# Patient Record
Sex: Male | Born: 1960 | Race: White | Hispanic: No | Marital: Married | Smoking: Never smoker
Health system: Southern US, Community
[De-identification: ages and names within clinical notes are randomized; demographics above are authoritative.]

## PROBLEM LIST (undated history)

## (undated) DIAGNOSIS — I519 Heart disease, unspecified: Secondary | ICD-10-CM

## (undated) DIAGNOSIS — K219 Gastro-esophageal reflux disease without esophagitis: Secondary | ICD-10-CM

## (undated) DIAGNOSIS — I219 Acute myocardial infarction, unspecified: Secondary | ICD-10-CM

## (undated) DIAGNOSIS — M519 Unspecified thoracic, thoracolumbar and lumbosacral intervertebral disc disorder: Secondary | ICD-10-CM

## (undated) DIAGNOSIS — E785 Hyperlipidemia, unspecified: Secondary | ICD-10-CM

## (undated) HISTORY — PX: SHOULDER ARTHROSCOPY: SHX128

## (undated) HISTORY — PX: WISDOM TOOTH EXTRACTION: SHX21

## (undated) HISTORY — DX: Hyperlipidemia, unspecified: E78.5

## (undated) HISTORY — DX: Acute myocardial infarction, unspecified: I21.9

## (undated) HISTORY — PX: TONSILLECTOMY AND ADENOIDECTOMY: SUR1326

## (undated) HISTORY — DX: Heart disease, unspecified: I51.9

## (undated) HISTORY — DX: Unspecified thoracic, thoracolumbar and lumbosacral intervertebral disc disorder: M51.9

## (undated) HISTORY — DX: Gastro-esophageal reflux disease without esophagitis: K21.9

---

## 2007-02-28 ENCOUNTER — Inpatient Hospital Stay (HOSPITAL_COMMUNITY): Admission: AD | Admit: 2007-02-28 | Discharge: 2007-03-04 | Payer: Self-pay | Admitting: Cardiovascular Disease

## 2007-02-28 DIAGNOSIS — I219 Acute myocardial infarction, unspecified: Secondary | ICD-10-CM | POA: Insufficient documentation

## 2007-02-28 HISTORY — DX: Acute myocardial infarction, unspecified: I21.9

## 2007-02-28 HISTORY — PX: CORONARY STENT PLACEMENT: SHX1402

## 2011-01-03 NOTE — Discharge Summary (Signed)
NAME:  Tommy Myers, TROIANI NO.:  [id number]   MEDICAL RECORD NO.:  [id number]          PATIENT TYPE:  INP   LOCATION:  2016                         FACILITY:  [facility]   PHYSICIAN:  Raymon Mutton, P.A. DATE OF BIRTH:  1960/10/28   DATE OF ADMISSION:  02/28/2007  DATE OF DISCHARGE:  03/04/2007                               DISCHARGE SUMMARY   DISCHARGE DIAGNOSES:  1. Status post acute diaphragmatic myocardial infarction, treated with      emergency __________ stenting of the right coronary artery on February 28, 2007.  2. Positive family history of coronary artery disease.  3. Mild left ventricular dysfunction with ejection fraction 45%.  4. Hypertension - asymptomatic.  5. Borderline hyperlipidemia on statin therapy.   HOSPITAL PROCEDURES:  Coronary angiography performed by Dr. Allyson Sabal on  February 28, 2007, which revealed 50% of left main stenosis versus spasms  and high-grade lesion in the middle of RCA, treated with the bare metal  stent.  All the rest of the coronaries were free of disease.  Cath also  revealed moderate hypokinesis of the inferior wall, and ejection  fraction was 45%.   HISTORY AND PHYSICAL AND HOSPITAL COURSE:  This is a 50 year old  Caucasian gentleman who presented to the emergency room after he  developed chest pain with shortness of breath and a profuse sweating and  weakness around 2:30 p.m. on the day of his presentation.  The patient  was jogging in the park, when all of a sudden, he developed severe  pressure in the mid chest radiating to the back and was also feeling  extremely clammy.  He came home, rested a little bit, wanted to take a  shower, but while in the shower, experienced few more episodes of severe  chest pain, which actually forced him to call EMS.  En route, the EKG  revealed ST elevation in the inferior leads.  The patient was given  aspirin 324 mg, started a bolus and drip of heparin and nitro and  presented straight to  the cath lab.  Code STEMI was initiated, and Dr.  Allyson Sabal performed emergency stenting of the RCA.  The RCA had thrombotic  lesion and extraction thrombectomy was performed.  So, there is  substantial thrombus.  Large vessel required  non-bare metal stent.  The  patient was given bolus and infusion of Integrilin, and the lesion was  reduced from 95% to 0%, and TIMI-3 flow was restored.   Next morning, the patient was seen by Dr. Allyson Sabal, and we continued  cycling his enzymes.  His peak enzyme abnormality revealed CK of 1624  and CK-MB to 140.   The patient was maintained in CCU for 72 hours and after that transfer  to step-down unit.  He had hypertension but was asymptomatic with that  and stated that his blood pressure usually runs low anyway.  On March 04, 2007, he was seen by Dr. Allyson Sabal, and it was the fourth day post MI, and  decision was made to discharge him home with return of his followup  within 2 weeks.  LABORATORY:  His CBC showed hemoglobin of 13.3, hematocrit 38.6, white  blood cell count 8.5, platelet count 242.  BMP revealed sodium 141,  potassium 3.8, chloride 103, CO2 of 28, glucose was 90, BUN 15,  creatinine 1.16, calcium 9.2.  Lipid profile showed total cholesterol  238, triglycerides 109, HDL 54, and LDL 162.  The patient was started on  statin therapy.   DISCHARGE DIET:  Low-salt, low-fat, low-cholesterol diet.   DISCHARGE ACTIVITY:  He was not allowed to drive or lift weights greater  than 10 pounds for 1 week.  Increase activity slowly and followup  appointment scheduled with Dr. Allyson Sabal on July 28 at 3:45 p.m.   DISCHARGE MEDICATIONS:  1. Aspirin 325 mg daily.  2. Plavix 75 mg daily.  3. Toprol XL 25 mg daily.  4. Inspra 50 mg daily.  5. Lipitor 80 mg daily.      Raymon Mutton, P.A.     MK/MEDQ  D:  03/04/2007  T:  03/04/2007  Job:  [id number]   cc:   Nanetta Batty, M.D.

## 2011-01-03 NOTE — Cardiovascular Report (Signed)
NAME:  JERMAN, TINNON NO.:  [id number]   MEDICAL RECORD NO.:  [id number]          PATIENT TYPE:  OIB   LOCATION:  2910                         FACILITY:  [facility]   PHYSICIAN:  Nanetta Batty, M.D.   DATE OF BIRTH:  Jun 01, 1961   DATE OF PROCEDURE:  02/28/2007  DATE OF DISCHARGE:                            CARDIAC CATHETERIZATION   HISTORY OF PRESENT ILLNESS:  Mr. Mcpeek is a 50 year old married white  male with positive risk factors including family history and occasional  smoking.  He is 27 and very fit appearing and athletic.  After running  and doing pushups today, and while taking a shower, he developed some  chest and back pain.  The pain became pronounced and constant around  2:00 p.m.  He presented to [facility] ER where he was found to have  inferior ST elevation.  He was treated with IV heparin bolus of 4000 and  infusion as well as baby aspirin.  He was transferred by [facility] to  [facility] for emergency cardiac catheterization and potential intervention.   DESCRIPTION OF PROCEDURE:  The patient was brought to the second floor  [facility] cardiac cath lab urgently for catheter intervention.   His right groin was prepped and draped in usual sterile fashion.  Xylocaine 1% was used local anesthesia.  A 6-French sheath was inserted  into the right femoral vein using standard Seldinger technique.  A 7-  French sheath was inserted into the right femoral artery.  A 5-French  balloon-tipped temporary transvenous pacemaker was placed in the RV apex  because of bradycardia noted on route to the cath lab.  It was noted  that the patient's central pressure was higher with his intrinsic beat  and lower with paced beats.  He apparently runs blood pressure in the  100-120 range.  Blood pressure ran in the 70-90 range, and therefore, he  was placed on IV dobutamine as well.  A 6-French right and left Judkins  diagnostic catheter as well as French pigtail catheter were used  for  selective coronary angiography, and left ventriculography respectively.  Visipaque dye was used entirety of the case.  Retrograde aortic, left  ventricular blood pressures were recorded.   HEMODYNAMIC RESULTS:  1. Aortic systolic pressure 88, diastolic pressure 74.  Ventricle      systolic pressure 89, end-diastolic pressure 6.   SELECTIVE CORONARY CHOLANGIOGRAPHY:  1. Left main; there was some tapering of the os in the left main in      the 40-50% range.  I suspect this is related to spasm.  2. LAD; widely patent.  3. Circumflex; nondominant widely patent.  4. Right coronary artery; this is a large super dominant vessel which      is the infarct-related vessel.  There was a 95% lesion in the      midportion with extensive thrombus extending from the lesion down      into the distal part of the vessel.   VENTRICULOGRAPHY:  RAO left ventriculogram was performed using 25 mL of  Visipaque dye at 12 mL per second.  The overall LVEF was estimated  at  approximately 40% with moderate inferior hypokinesia.   DESCRIPTION OF PROCEDURE:  The patient received an additional 2000 units  of heparin, making a total of 6000.  The ending ACT was 284.  He  received aspirin in the emergency room, 600 mg of p.o. Plavix,  Integrilin double bolus infusion.  Isovue dye was used through the  entirety of the case.  Aortic pressures monitored during the case.  The  patient did receive 200 mcg intracoronary nitroglycerin.   Using a 7-French JR-4 side-hole guide catheter along with an OM4 190  Asahi wire and a [place] extraction thrombectomy catheter, multiple  passes were performed across the lesion into the distal lesion removing  a significant amount of thrombus with marked thrombus resolution.  Following this, prep stenting was performed with a 50 x 20 Liberte stent  at 10-12 atmospheres and postdilated with a 50 15-Quantum Maverick at 18  atmospheres (5.2 mm).  Following this intravascular  ultrasound  interrogation was performed of the RCA beginning distal to the stent and  extending into the proximal right coronary artery.  The distal reference  segment measured 4.9 x 5.1 mm.  There was an area of plaque just distal  to the stent that was not covered by the stent measuring approximately  2.5 x .2.5 (5 mm squared).  The stent was well opposed throughout its  entirety.  Because of the residual plaque in a large dominant vessel, an  additional stent was used to cover this plaque (5.0 x 12 mm Liberte).  It was deployed at 12 atmospheres and postdilated with the 50 15-mm  Quantum Maverick at 18 atmospheres.  Completion of this was performed  revealing excellent stent apposition throughout its entirety.  Completion of the angiogram revealed TIMI III flow with intact PDA and  PLA.  The patient tolerated the procedure well.  The guidewire and  catheter were removed.  The sheaths were sewn securely in place, and  temporary transvenous pacemaker was secured in place.   IMPRESSION:  Successful extraction thrombectomy, PCI and stenting using  bare metal stent because of the size of the vessel with IBIS guidance.  I am going to keep the patient on dopamine to maintain his systolic  blood pressure greater than 90.  He did receive 0.5 mg of atropine  during the case because of bradycardia and hypertension.  The arterial  sheath will be removed once the ACT falls below 170.  The Integrilin  will be continued for 18 hours.  He will use aspirin, Plavix, low-dose  beta blocker, low-dose ACE inhibitor and statin drug, blood pressure  permitting.  He left the cath lab hemodynamically stable.  He had  residual chest pain and residual ST-segment elevation.      Nanetta Batty, M.D.  Electronically Signed     JB/MEDQ  D:  02/28/2007  T:  03/01/2007  Job:  [id number]   cc:   Cardiac Catheterization Lab  [facility]  Chales Salmon. Abigail Miyamoto, M.D.

## 2011-06-06 LAB — LIPID PANEL
LDL Cholesterol: 162 — ABNORMAL HIGH
Triglycerides: 109
VLDL: 22

## 2011-06-06 LAB — CARDIAC PANEL(CRET KIN+CKTOT+MB+TROPI)
CK, MB: 240.4 — ABNORMAL HIGH
CK, MB: 29.1 — ABNORMAL HIGH
Relative Index: 14.8 — ABNORMAL HIGH
Total CK: 1861 — ABNORMAL HIGH
Troponin I: 29.8
Troponin I: 32.09

## 2011-06-06 LAB — BASIC METABOLIC PANEL
BUN: 15
BUN: 21
CO2: 26
CO2: 29
Calcium: 8.3 — ABNORMAL LOW
Calcium: 8.7
Calcium: 9.2
Chloride: 101
Creatinine, Ser: 1.16
Creatinine, Ser: 1.16
GFR calc Af Amer: >60
GFR calc Af Amer: >60
GFR calc non Af Amer: >60
GFR calc non Af Amer: >60
Glucose, Bld: 150 — ABNORMAL HIGH
Glucose, Bld: 90
Potassium: 3.3 — ABNORMAL LOW
Sodium: 134 — ABNORMAL LOW
Sodium: 136
Sodium: 140
Sodium: 141

## 2011-06-06 LAB — CK TOTAL AND CKMB (NOT AT ARMC)
CK, MB: 182.6 — ABNORMAL HIGH
CK, MB: 96.1 — ABNORMAL HIGH
Relative Index: 9.6 — ABNORMAL HIGH
Total CK: 996 — ABNORMAL HIGH

## 2011-06-06 LAB — CBC
HCT: 41.3
MCV: 90.3
Platelets: 242
Platelets: 282
RDW: 12.9
RDW: 13.3

## 2011-06-06 LAB — COMPREHENSIVE METABOLIC PANEL
Albumin: 3.9
BUN: 20
Creatinine, Ser: 1.17
Potassium: 3.9
Total Protein: 6.4

## 2011-06-06 LAB — PROTIME-INR: INR: 1

## 2011-06-06 LAB — APTT: aPTT: 30

## 2011-06-06 LAB — TROPONIN I: Troponin I: 28.75

## 2012-09-19 ENCOUNTER — Encounter: Payer: Self-pay | Admitting: Internal Medicine

## 2012-09-19 ENCOUNTER — Ambulatory Visit (INDEPENDENT_AMBULATORY_CARE_PROVIDER_SITE_OTHER): Payer: Managed Care, Other (non HMO) | Admitting: Internal Medicine

## 2012-09-19 VITALS — BP 110/72 | HR 72 | Temp 98.1°F | Ht 76.0 in | Wt 208.0 lb

## 2012-09-19 DIAGNOSIS — J019 Acute sinusitis, unspecified: Secondary | ICD-10-CM

## 2012-09-19 MED ORDER — AMOXICILLIN-POT CLAVULANATE 875-125 MG PO TABS: 1.0000 | ORAL_TABLET | Freq: Two times a day (BID) | ORAL | Status: DC

## 2012-09-19 NOTE — Progress Notes (Signed)
HPI  Pt presents to the clinic today with c/o of fatigue, nasal congestion, headache, cough and fatigue x 5 days. He feels a lot of sinus pressure. He has taken tylenol and mucinex without much relief. The symptoms seem to get worse each day. He is coughing and producing some thick green sputum. He does not have a history of allergies or asthma. He has had sick contacts.  Review of Systems    Past Medical History  Diagnosis Date  . History of chicken pox   . Hyperlipidemia   . Heart disease   . Heart attack 02/28/2007    History reviewed. No pertinent family history.  History   Social History  . Marital Status: Married    Spouse Name: N/A    Number of Children: N/A  . Years of Education: N/A   Occupational History  . Not on file.   Social History Main Topics  . Smoking status: Never Smoker   . Smokeless tobacco: Not on file  . Alcohol Use: Not on file  . Drug Use: Not on file  . Sexually Active: Not on file   Other Topics Concern  . Not on file   Social History Narrative  . No narrative on file    No Known Allergies   Constitutional: Positive headache, fatigue and fever. Denies abrupt weight changes.  HEENT:  Positive eye pain, pressure behind the eyes, facial pain, nasal congestion and sore throat. Denies eye redness, ear pain, ringing in the ears, wax buildup, runny nose or bloody nose. Respiratory: Positive cough and thick green sputum production. Denies difficulty breathing or shortness of breath.  Cardiovascular: Denies chest pain, chest tightness, palpitations or swelling in the hands or feet.   No other specific complaints in a complete review of systems (except as listed in HPI above).  Objective:    BP 110/72  Pulse 72  Temp 98.1 F (36.7 C) (Oral)  Ht 6\' 4"  (1.93 m)  Wt 208 lb (94.348 kg)  BMI 25.32 kg/m2  SpO2 98% Wt Readings from Last 3 Encounters:  09/19/12 208 lb (94.348 kg)    General: Appears his stated age, well developed, well  nourished in NAD. HEENT: Head: normal shape and size; Eyes: sclera white, no icterus, conjunctiva pink, PERRLA and EOMs intact; Ears: Tm's gray and intact, normal light reflex; Nose: mucosa pink and moist, septum midline; Throat/Mouth: + PND. Teeth present, mucosa pink and moist, no exudate noted, no lesions or ulcerations noted.  Neck: Mild cervical lymphadenopathy. Neck supple, trachea midline. No massses, lumps or thyromegaly present.  Cardiovascular: Normal rate and rhythm. S1,S2 noted.  No murmur, rubs or gallops noted. No JVD or BLE edema. No carotid bruits noted. Pulmonary/Chest: Normal effort and positive vesicular breath sounds. No respiratory distress. No wheezes, rales or ronchi noted.      Assessment & Plan:   Acute bacterial sinusitis  Can use a Neti Pot which can be purchased from your local drug store. Flonase 2 sprays each nostril for 3 days and then as needed. Augmentin BID for 10 days  RTC as needed or if symptoms persist.

## 2012-09-19 NOTE — Patient Instructions (Signed)

## 2013-01-22 ENCOUNTER — Encounter: Payer: Self-pay | Admitting: Internal Medicine

## 2013-01-22 ENCOUNTER — Ambulatory Visit (INDEPENDENT_AMBULATORY_CARE_PROVIDER_SITE_OTHER): Payer: Managed Care, Other (non HMO) | Admitting: Internal Medicine

## 2013-01-22 VITALS — BP 132/92 | HR 72 | Temp 98.2°F | Ht 76.0 in | Wt 206.0 lb

## 2013-01-22 DIAGNOSIS — Z1211 Encounter for screening for malignant neoplasm of colon: Secondary | ICD-10-CM

## 2013-01-22 DIAGNOSIS — Z0000 Encounter for general adult medical examination without abnormal findings: Secondary | ICD-10-CM

## 2013-01-22 NOTE — Patient Instructions (Signed)
Health Maintenance, Males A healthy lifestyle and preventative care can promote health and wellness.  Maintain regular health, dental, and eye exams.  Eat a healthy diet. Foods like vegetables, fruits, whole grains, low-fat dairy products, and lean protein foods contain the nutrients you need without too many calories. Decrease your intake of foods high in solid fats, added sugars, and salt. Get information about a proper diet from your caregiver, if necessary.  Regular physical exercise is one of the most important things you can do for your health. Most adults should get at least 150 minutes of moderate-intensity exercise (any activity that increases your heart rate and causes you to sweat) each week. In addition, most adults need muscle-strengthening exercises on 2 or more days a week.   Maintain a healthy weight. The body mass index (BMI) is a screening tool to identify possible weight problems. It provides an estimate of body fat based on height and weight. Your caregiver can help determine your BMI, and can help you achieve or maintain a healthy weight. For adults 20 years and older:  A BMI below 18.5 is considered underweight.  A BMI of 18.5 to 24.9 is normal.  A BMI of 25 to 29.9 is considered overweight.  A BMI of 30 and above is considered obese.  Maintain normal blood lipids and cholesterol by exercising and minimizing your intake of saturated fat. Eat a balanced diet with plenty of fruits and vegetables. Blood tests for lipids and cholesterol should begin at age 20 and be repeated every 5 years. If your lipid or cholesterol levels are high, you are over 50, or you are a high risk for heart disease, you may need your cholesterol levels checked more frequently.Ongoing high lipid and cholesterol levels should be treated with medicines, if diet and exercise are not effective.  If you smoke, find out from your caregiver how to quit. If you do not use tobacco, do not start.  If you  choose to drink alcohol, do not exceed 2 drinks per day. One drink is considered to be 12 ounces (355 mL) of beer, 5 ounces (148 mL) of wine, or 1.5 ounces (44 mL) of liquor.  Avoid use of street drugs. Do not share needles with anyone. Ask for help if you need support or instructions about stopping the use of drugs.  High blood pressure causes heart disease and increases the risk of stroke. Blood pressure should be checked at least every 1 to 2 years. Ongoing high blood pressure should be treated with medicines if weight loss and exercise are not effective.  If you are 45 to 52 years old, ask your caregiver if you should take aspirin to prevent heart disease.  Diabetes screening involves taking a blood sample to check your fasting blood sugar level. This should be done once every 3 years, after age 45, if you are within normal weight and without risk factors for diabetes. Testing should be considered at a younger age or be carried out more frequently if you are overweight and have at least 1 risk factor for diabetes.  Colorectal cancer can be detected and often prevented. Most routine colorectal cancer screening begins at the age of 50 and continues through age 75. However, your caregiver may recommend screening at an earlier age if you have risk factors for colon cancer. On a yearly basis, your caregiver may provide home test kits to check for hidden blood in the stool. Use of a small camera at the end of a tube,   to directly examine the colon (sigmoidoscopy or colonoscopy), can detect the earliest forms of colorectal cancer. Talk to your caregiver about this at age 50, when routine screening begins. Direct examination of the colon should be repeated every 5 to 10 years through age 75, unless early forms of pre-cancerous polyps or small growths are found.  Hepatitis C blood testing is recommended for all people born from 1945 through 1965 and any individual with known risks for hepatitis C.  Healthy  men should no longer receive prostate-specific antigen (PSA) blood tests as part of routine cancer screening. Consult with your caregiver about prostate cancer screening.  Testicular cancer screening is not recommended for adolescents or adult males who have no symptoms. Screening includes self-exam, caregiver exam, and other screening tests. Consult with your caregiver about any symptoms you have or any concerns you have about testicular cancer.  Practice safe sex. Use condoms and avoid high-risk sexual practices to reduce the spread of sexually transmitted infections (STIs).  Use sunscreen with a sun protection factor (SPF) of 30 or greater. Apply sunscreen liberally and repeatedly throughout the day. You should seek shade when your shadow is shorter than you. Protect yourself by wearing long sleeves, pants, a wide-brimmed hat, and sunglasses year round, whenever you are outdoors.  Notify your caregiver of new moles or changes in moles, especially if there is a change in shape or color. Also notify your caregiver if a mole is larger than the size of a pencil eraser.  A one-time screening for abdominal aortic aneurysm (AAA) and surgical repair of large AAAs by sound wave imaging (ultrasonography) is recommended for ages 65 to 75 years who are current or former smokers.  Stay current with your immunizations. Document Released: 02/03/2008 Document Revised: 10/30/2011 Document Reviewed: 01/02/2011 [employer] Patient Information 2014 [employer].  

## 2013-01-22 NOTE — Progress Notes (Signed)
HPI  Pt presents to the clinic today to establish care. He does not have a PCP. He does follow with cardiology regarding his HTN, hyperlipidemia. He does go to the [place] for back pain management. He does have some concerns today about a muscle strain just below his left shoulder blade. He strained it 2 weeks ago. He took ibuprofen and flexeril which did help. The achyness has not completely resolved though and this concerns him.  Flu: 04/2012 Tetanus: 2007 Colonoscopy: never Eye doctor: yearly Dentist: yearly  Past Medical History  Diagnosis Date  . History of chicken pox   . Hyperlipidemia   . Heart disease   . Heart attack 02/28/2007    Current Outpatient Prescriptions  Medication Sig Dispense Refill  . aspirin 81 MG tablet Take 81 mg by mouth daily.      . metoprolol tartrate (LOPRESSOR) 25 MG tablet Take 12.5 mg by mouth 2 (two) times daily.      . Multiple Vitamin (MULTIVITAMIN) tablet Take 1 tablet by mouth daily.      . Omega-3 Fatty Acids (FISH OIL) 1000 MG CAPS Take 1 capsule by mouth daily.      . rosuvastatin (CRESTOR) 20 MG tablet Take 10 mg by mouth daily.      . Testosterone (ANDROGEL) 20.25 MG/1.25GM (1.62%) GEL Place onto the skin daily.       No current facility-administered medications for this visit.    No Known Allergies  Family History  Problem Relation Age of Onset  . Cancer Neg Hx   . Diabetes Neg Hx   . Stroke Neg Hx     History   Social History  . Marital Status: Married    Spouse Name: N/A    Number of Children: N/A  . Years of Education: N/A   Occupational History  . Not on file.   Social History Main Topics  . Smoking status: Never Smoker   . Smokeless tobacco: Not on file  . Alcohol Use: 0.6 oz/week    1 Glasses of wine per week  . Drug Use: No  . Sexually Active: Yes   Other Topics Concern  . Not on file   Social History Narrative  . No narrative on file    ROS:  Constitutional: Denies fever, malaise, fatigue, headache or  abrupt weight changes.  HEENT: Denies eye pain, eye redness, ear pain, ringing in the ears, wax buildup, runny nose, nasal congestion, bloody nose, or sore throat. Respiratory: Denies difficulty breathing, shortness of breath, cough or sputum production.   Cardiovascular: Denies chest pain, chest tightness, palpitations or swelling in the hands or feet.  Gastrointestinal: Denies abdominal pain, bloating, constipation, diarrhea or blood in the stool.  GU: Denies frequency, urgency, pain with urination, blood in urine, odor or discharge. Musculoskeletal: Pt reports muscle strain of back. Denies decrease in range of motion, difficulty with gait, or joint pain and swelling.  Skin: Denies redness, rashes, lesions or ulcercations.  Neurological: Denies dizziness, difficulty with memory, difficulty with speech or problems with balance and coordination.   No other specific complaints in a complete review of systems (except as listed in HPI above).  PE:  BP 132/92  Pulse 72  Temp(Src) 98.2 F (36.8 C) (Oral)  Ht 6\' 4"  (1.93 m)  Wt 206 lb (93.441 kg)  BMI 25.09 kg/m2  SpO2 97% Wt Readings from Last 3 Encounters:  01/22/13 206 lb (93.441 kg)  09/19/12 208 lb (94.348 kg)    General: Appears his stated  age, well developed, well nourished in NAD. HEENT: Head: normal shape and size; Eyes: sclera white, no icterus, conjunctiva pink, PERRLA and EOMs intact; Ears: Tm's gray and intact, normal light reflex; Nose: mucosa pink and moist, septum midline; Throat/Mouth: Teeth present, mucosa pink and moist, no lesions or ulcerations noted.  Neck: Normal range of motion. Neck supple, trachea midline. No massses, lumps or thyromegaly present.  Cardiovascular: Normal rate and rhythm. S1,S2 noted.  No murmur, rubs or gallops noted. No JVD or BLE edema. No carotid bruits noted. Pulmonary/Chest: Normal effort and positive vesicular breath sounds. No respiratory distress. No wheezes, rales or ronchi noted.   Abdomen: Soft and nontender. Normal bowel sounds, no bruits noted. No distention or masses noted. Liver, spleen and kidneys non palpable. Musculoskeletal: Normal range of motion. No signs of joint swelling. No difficulty with gait.  Neurological: Alert and oriented. Cranial nerves II-XII intact. Coordination normal. +DTRs bilaterally. Psychiatric: Mood and affect normal. Behavior is normal. Judgment and thought content normal.     BMET    Component Value Date/Time   NA 141 03/04/2007 0422   K 3.8 03/04/2007 0422   CL 103 03/04/2007 0422   CO2 28 03/04/2007 0422   GLUCOSE 90 03/04/2007 0422   BUN 15 03/04/2007 0422   CREATININE 1.16 03/04/2007 0422   CALCIUM 9.2 03/04/2007 0422   GFRNONAA >60 03/04/2007 0422   GFRAA  Value: >60        The eGFR has been calculated using the MDRD equation. This calculation has not been validated in all clinical 03/04/2007 0422    Lipid Panel     Component Value Date/Time   CHOL  Value: 238 (NOTE) ATP III Classification:      < 200        mg/dL        Desirable     161 - 239     mg/dL        Borderline High     >= 240        mg/dL        High * 0/96/0454 1832   TRIG 109 02/28/2007 1832   HDL 54 02/28/2007 1832   CHOLHDL 4.4 02/28/2007 1832   VLDL 22 02/28/2007 1832   LDLCALC  Value: 162 (NOTE)  Total Cholesterol/HDL Ratio:CHD Risk                       Coronary Heart Disease Risk Table                                       Men       Women         1/2 Average Risk              3.4        3.3             Average Risk              5.0         4.4         2 X Average Risk              9.6        7.1         3 X Average Risk             23.4  11.0 Use the calculated Patient Ratio above and the CHD Risk table  to determine the patient's CHD Risk. ATP III Classification (LDL):      < 100         mg/dL         Optimal     161 - 129     mg/dL         Near or Above Optimal     130 - 159     mg/dL         Borderline High     160 - 189     mg/dL         High      > 096         mg/dL         Very High * 0/45/4098 1832    CBC    Component Value Date/Time   WBC 8.5 03/01/2007 0405   RBC 4.25 03/01/2007 0405   HGB 13.3 03/01/2007 0405   HCT 38.6* 03/01/2007 0405   PLT 242 03/01/2007 0405   MCV 90.7 03/01/2007 0405   MCHC 34.4 03/01/2007 0405   RDW 12.9 03/01/2007 0405    Hgb A1C No results found for this basename: HGBA1C     Assessment and Plan:  Preventative Health Maintenance:  Continue to work on diet and exercise Will set up for colonoscopy Labs from [place] reviewed and scanned into EMR  Muscle strain:  Continue with flexeril and ibuprofen Try putting a heating pad on the area Stretching exercises shown Reassurance that this will heal over time

## 2013-01-23 ENCOUNTER — Encounter: Payer: Self-pay | Admitting: Gastroenterology

## 2013-01-29 ENCOUNTER — Encounter: Payer: Self-pay | Admitting: Internal Medicine

## 2013-02-03 ENCOUNTER — Ambulatory Visit (INDEPENDENT_AMBULATORY_CARE_PROVIDER_SITE_OTHER): Payer: Managed Care, Other (non HMO) | Admitting: Internal Medicine

## 2013-02-03 ENCOUNTER — Other Ambulatory Visit: Payer: Self-pay | Admitting: Internal Medicine

## 2013-02-03 ENCOUNTER — Encounter: Payer: Self-pay | Admitting: Internal Medicine

## 2013-02-03 ENCOUNTER — Ambulatory Visit (INDEPENDENT_AMBULATORY_CARE_PROVIDER_SITE_OTHER)
Admission: RE | Admit: 2013-02-03 | Discharge: 2013-02-03 | Disposition: A | Discharge status: Home / Self Care | Payer: Managed Care, Other (non HMO) | Source: Ambulatory Visit | Attending: Internal Medicine | Admitting: Internal Medicine

## 2013-02-03 VITALS — BP 128/82 | HR 66 | Temp 97.4°F | Ht 76.0 in | Wt 201.0 lb

## 2013-02-03 DIAGNOSIS — M545 Low back pain: Secondary | ICD-10-CM

## 2013-02-03 DIAGNOSIS — M25511 Pain in right shoulder: Secondary | ICD-10-CM

## 2013-02-03 DIAGNOSIS — M25519 Pain in unspecified shoulder: Secondary | ICD-10-CM

## 2013-02-03 DIAGNOSIS — M549 Dorsalgia, unspecified: Secondary | ICD-10-CM

## 2013-02-03 MED ORDER — KETOROLAC TROMETHAMINE 30 MG/ML IJ SOLN
30.0000 mg | Freq: Once | INTRAMUSCULAR | Status: AC
  Administered 2013-02-03: 30 mg via INTRAMUSCULAR

## 2013-02-03 MED ORDER — HYDROCODONE-ACETAMINOPHEN 10-325 MG PO TABS: 1.0000 | ORAL_TABLET | Freq: Three times a day (TID) | ORAL | Status: DC | PRN

## 2013-02-03 MED ORDER — METHYLPREDNISOLONE ACETATE 80 MG/ML IJ SUSP
80.0000 mg | Freq: Once | INTRAMUSCULAR | Status: AC
  Administered 2013-02-03: 80 mg via INTRAMUSCULAR

## 2013-02-03 NOTE — Addendum Note (Signed)
Addended by: Carin Primrose on: 02/03/2013 09:52 AM   Modules accepted: Orders

## 2013-02-03 NOTE — Patient Instructions (Signed)
Back Pain, Adult  Low back pain is very common. About 1 in 5 people have back pain. The cause of low back pain is rarely dangerous. The pain often gets better over time. About half of people with a sudden onset of back pain feel better in just 2 weeks. About 8 in 10 people feel better by 6 weeks.   CAUSES  Some common causes of back pain include:  · Strain of the muscles or ligaments supporting the spine.  · Wear and tear (degeneration) of the spinal discs.  · Arthritis.  · Direct injury to the back.  DIAGNOSIS  Most of the time, the direct cause of low back pain is not known. However, back pain can be treated effectively even when the exact cause of the pain is unknown. Answering your caregiver's questions about your overall health and symptoms is one of the most accurate ways to make sure the cause of your pain is not dangerous. If your caregiver needs more information, he or she may order lab work or imaging tests (X-rays or MRIs). However, even if imaging tests show changes in your back, this usually does not require surgery.  HOME CARE INSTRUCTIONS  For many people, back pain returns. Since low back pain is rarely dangerous, it is often a condition that people can learn to manage on their own.   · Remain active. It is stressful on the back to sit or stand in one place. Do not sit, drive, or stand in one place for more than 30 minutes at a time. Take short walks on level surfaces as soon as pain allows. Try to increase the length of time you walk each day.  · Do not stay in bed. Resting more than 1 or 2 days can delay your recovery.  · Do not avoid exercise or work. Your body is made to move. It is not dangerous to be active, even though your back may hurt. Your back will likely heal faster if you return to being active before your pain is gone.  · Pay attention to your body when you  bend and lift. Many people have less discomfort when lifting if they bend their knees, keep the load close to their bodies, and  avoid twisting. Often, the most comfortable positions are those that put less stress on your recovering back.  · Find a comfortable position to sleep. Use a firm mattress and lie on your side with your knees slightly bent. If you lie on your back, put a pillow under your knees.  · Only take over-the-counter or prescription medicines as directed by your caregiver. Over-the-counter medicines to reduce pain and inflammation are often the most helpful. Your caregiver may prescribe muscle relaxant drugs. These medicines help dull your pain so you can more quickly return to your normal activities and healthy exercise.  · Put ice on the injured area.  · Put ice in a plastic bag.  · Place a towel between your skin and the bag.  · Leave the ice on for 15-20 minutes, 3-4 times a day for the first 2 to 3 days. After that, ice and heat may be alternated to reduce pain and spasms.  · Ask your caregiver about trying back exercises and gentle massage. This may be of some benefit.  · Avoid feeling anxious or stressed. Stress increases muscle tension and can worsen back pain. It is important to recognize when you are anxious or stressed and learn ways to manage it. Exercise is a great option.  SEEK MEDICAL CARE IF:  · You have pain that is not relieved with rest or   medicine.  · You have pain that does not improve in 1 week.  · You have new symptoms.  · You are generally not feeling well.  SEEK IMMEDIATE MEDICAL CARE IF:   · You have pain that radiates from your back into your legs.  · You develop new bowel or bladder control problems.  · You have unusual weakness or numbness in your arms or legs.  · You develop nausea or vomiting.  · You develop abdominal pain.  · You feel faint.  Document Released: 08/07/2005 Document Revised: 02/06/2012 Document Reviewed: 12/26/2010  Exit[employer]® Patient Information ©2014 [employer].

## 2013-02-03 NOTE — Progress Notes (Signed)
Subjective:    Patient ID: Tommy Myers, male    DOB: 14-Feb-1961, 52 y.o.   MRN: [id number]  HPI  Pt presents to the clinic today with c/o back pain. This started last night. The pain is a constant 10/10. He denies any specific injury to the area. He was bending down in the yard messing with a hose when all of a sudden he felt a pop in his back. He was not able to get up on his own. He is having some tingling down his left leg. He denies loss of bowel or bladder. He has taken extra strength tylenol and flexeril for the pain which has not helped. He does have a history of 1 bulging disc and 2 herniated discs in L3-L5. Additionally, he continues to c/o right shoulder pain. It seems as if it is a constant ache, 3/10. He has had similar shoulder pain in the past and had to have it scoped by Dr. Teressa Senter. He does take tylenol which does help with this pain. He denies numbness or tingling in the right arm/hand. He has not had a specific injury to his shoulder.  Review of Systems      Past Medical History  Diagnosis Date  . History of chicken pox   . Hyperlipidemia   . Heart disease   . Heart attack 02/28/2007    Current Outpatient Prescriptions  Medication Sig Dispense Refill  . aspirin 81 MG tablet Take 81 mg by mouth daily.      . metoprolol tartrate (LOPRESSOR) 25 MG tablet Take 12.5 mg by mouth 2 (two) times daily.      . Multiple Vitamin (MULTIVITAMIN) tablet Take 1 tablet by mouth daily.      . Omega-3 Fatty Acids (FISH OIL) 1000 MG CAPS Take 1 capsule by mouth daily.      . rosuvastatin (CRESTOR) 20 MG tablet Take 10 mg by mouth daily.      . Testosterone (ANDROGEL) 20.25 MG/1.25GM (1.62%) GEL Place onto the skin daily.       No current facility-administered medications for this visit.    No Known Allergies  Family History  Problem Relation Age of Onset  . Cancer Neg Hx   . Diabetes Neg Hx   . Stroke Neg Hx     History   Social History  . Marital Status: Married   Spouse Name: N/A    Number of Children: N/A  . Years of Education: N/A   Occupational History  . Not on file.   Social History Main Topics  . Smoking status: Never Smoker   . Smokeless tobacco: Not on file  . Alcohol Use: 0.6 oz/week    1 Glasses of wine per week  . Drug Use: No  . Sexually Active: Yes   Other Topics Concern  . Not on file   Social History Narrative  . No narrative on file     Constitutional: Denies fever, malaise, fatigue, headache or abrupt weight changes.  Musculoskeletal: Pt reports back pain and right shoulder pain.  Skin: Denies redness, rashes, lesions or ulcercations.  Neurological: Pt reports tingling in left leg. Denies dizziness, difficulty with memory, difficulty with speech or problems with balance and coordination.   No other specific complaints in a complete review of systems (except as listed in HPI above).  Objective:   Physical Exam  BP 128/82  Pulse 66  Temp(Src) 97.4 F (36.3 C) (Oral)  Ht 6\' 4"  (1.93 m)  Wt 201 lb (91.173  kg)  BMI 24.48 kg/m2  SpO2 96% Wt Readings from Last 3 Encounters:  02/03/13 201 lb (91.173 kg)  01/22/13 206 lb (93.441 kg)  09/19/12 208 lb (94.348 kg)    General: Appears his stated age, well developed, well nourished in NAD. Skin: Warm, dry and intact. No rashes, lesions or ulcerations noted..  Neck: Normal range of motion. Neck supple, trachea midline. No massses, lumps or thyromegaly present.  Cardiovascular: Normal rate and rhythm. S1,S2 noted.  No murmur, rubs or gallops noted. No JVD or BLE edema. No carotid bruits noted. Pulmonary/Chest: Normal effort and positive vesicular breath sounds. No respiratory distress. No wheezes, rales or ronchi noted.  Musculoskeletal: Pinpoint tenderness of the lumbar spine. Positive straight leg raise. No difficulty with gait. Strength 5/5 BUE, 5/5 BLE. Neurological: Alert and oriented. Cranial nerves II-XII intact. Coordination normal. +DTRs bilaterally.   BMET     Component Value Date/Time   NA 141 03/04/2007 0422   K 3.8 03/04/2007 0422   CL 103 03/04/2007 0422   CO2 28 03/04/2007 0422   GLUCOSE 90 03/04/2007 0422   BUN 15 03/04/2007 0422   CREATININE 1.16 03/04/2007 0422   CALCIUM 9.2 03/04/2007 0422   GFRNONAA >60 03/04/2007 0422   GFRAA  Value: >60        The eGFR has been calculated using the MDRD equation. This calculation has not been validated in all clinical 03/04/2007 0422    Lipid Panel     Component Value Date/Time   CHOL  Value: 238 (NOTE) ATP III Classification:      < 200        mg/dL        Desirable     413 - 239     mg/dL        Borderline High     >= 240        mg/dL        High * 2/44/0102 1832   TRIG 109 02/28/2007 1832   HDL 54 02/28/2007 1832   CHOLHDL 4.4 02/28/2007 1832   VLDL 22 02/28/2007 1832   LDLCALC  Value: 162 (NOTE)  Total Cholesterol/HDL Ratio:CHD Risk                       Coronary Heart Disease Risk Table                                       Men       Women         1/2 Average Risk              3.4        3.3             Average Risk              5.0         4.4         2 X Average Risk              9.6        7.1         3 X Average Risk             23.4       11.0 Use the calculated Patient Ratio above and the CHD Risk table  to determine the patient's CHD Risk. ATP III  Classification (LDL):      < 100         mg/dL         Optimal     161 - 129     mg/dL         Near or Above Optimal     130 - 159     mg/dL         Borderline High     160 - 189     mg/dL         High      > 096        mg/dL         Very High * 0/45/4098 1832    CBC    Component Value Date/Time   WBC 8.5 03/01/2007 0405   RBC 4.25 03/01/2007 0405   HGB 13.3 03/01/2007 0405   HCT 38.6* 03/01/2007 0405   PLT 242 03/01/2007 0405   MCV 90.7 03/01/2007 0405   MCHC 34.4 03/01/2007 0405   RDW 12.9 03/01/2007 0405    Hgb A1C No results found for this basename: HGBA1C          Assessment & Plan:   Low back pain, acute, new onset with additional workup  required:  Will obtain xray of lumbar spine to assess for herniated disc Continue flexeril eRx for Norco May need  Referral to Neurosurgeon versus PT  Right shoulder pain, chronic, with additional workup required:  Will obtain xray of right shoulder Shoulder exercises given  Will f/u after xrays are back

## 2013-03-18 ENCOUNTER — Ambulatory Visit (AMBULATORY_SURGERY_CENTER): Payer: Managed Care, Other (non HMO) | Admitting: *Deleted

## 2013-03-18 VITALS — Ht 76.0 in | Wt 210.2 lb

## 2013-03-18 DIAGNOSIS — Z1211 Encounter for screening for malignant neoplasm of colon: Secondary | ICD-10-CM

## 2013-03-18 MED ORDER — MOVIPREP 100 G PO SOLR: 1.0000 | Freq: Once | ORAL | Status: DC

## 2013-03-18 NOTE — Progress Notes (Signed)
No egg or soy allergy. ewm pt does tend to have nausea post sedation. ewm No cpap or home 02 use. ewm No past sedation problems except the nausea. ewm Nothing electrical in the heart. ewm emmi video to pts email. ewm Pt concerned about sedation. He may want to try procedure without sedation and get the minimal amount if becomes uncomfortable but will think about this and talk with Dr Christella Hartigan the day of the procedure. ewm

## 2013-03-19 ENCOUNTER — Encounter: Payer: Self-pay | Admitting: Gastroenterology

## 2013-03-28 ENCOUNTER — Ambulatory Visit (AMBULATORY_SURGERY_CENTER): Payer: Managed Care, Other (non HMO) | Admitting: Gastroenterology

## 2013-03-28 ENCOUNTER — Encounter: Payer: Self-pay | Admitting: Gastroenterology

## 2013-03-28 VITALS — BP 115/80 | HR 83 | Temp 97.9°F | Resp 29 | Ht 76.0 in | Wt 210.0 lb

## 2013-03-28 DIAGNOSIS — Z1211 Encounter for screening for malignant neoplasm of colon: Secondary | ICD-10-CM

## 2013-03-28 MED ORDER — SODIUM CHLORIDE 0.9 % IV SOLN: 500.0000 mL | INTRAVENOUS | Status: DC

## 2013-03-28 NOTE — Progress Notes (Addendum)
Pt has hx of post op N/V and states he would like light sedation if possible. emw All meds titrated per dr Christella Hartigan for procedure. ewm

## 2013-03-28 NOTE — Progress Notes (Signed)
Patient did not have preoperative order for IV antibiotic SSI prophylaxis. (G8918)  Patient did not experience any of the following events: a burn prior to discharge; a fall within the facility; wrong site/side/patient/procedure/implant event; or a hospital transfer or hospital admission upon discharge from the facility. (G8907)  

## 2013-03-28 NOTE — Patient Instructions (Addendum)

## 2013-03-28 NOTE — Op Note (Signed)
[facility] [address] [place], [address]   COLONOSCOPY PROCEDURE REPORT  PATIENT: Tommy Myers, Tommy Myers  MR#: [id number] BIRTHDATE: Jan 04, 1961 , 52  yrs. old GENDER: Male ENDOSCOPIST: Rachael Fee, MD REFERRED BY: Nicki Reaper, MD PROCEDURE DATE:  03/28/2013 PROCEDURE:   Colonoscopy, screening First Screening Colonoscopy - Avg.  risk and is 50 yrs.  old or older Yes.  Prior Negative Screening - Now for repeat screening. N/A  History of Adenoma - Now for follow-up colonoscopy & has been > or = to 3 yrs.  N/A  Polyps Removed Today? No.  Recommend repeat exam, <10 yrs? No. ASA CLASS:   Class II INDICATIONS:average risk screening. MEDICATIONS: Fentanyl 25 mcg IV, Versed 4 mg IV, and These medications were titrated to patient response per physician's verbal order  DESCRIPTION OF PROCEDURE:   After the risks benefits and alternatives of the procedure were thoroughly explained, informed consent was obtained.  A digital rectal exam revealed no abnormalities of the rectum.   The LB [id number]  endoscope was introduced through the anus and advanced to the cecum, which was identified by both the appendix and ileocecal valve. No adverse events experienced.   The quality of the prep was good.  The instrument was then slowly withdrawn as the colon was fully examined.   COLON FINDINGS: A normal appearing cecum, ileocecal valve, and appendiceal orifice were identified.  The ascending, hepatic flexure, transverse, splenic flexure, descending, sigmoid colon and rectum appeared unremarkable.  No polyps or cancers were seen. Retroflexed views revealed no abnormalities. The time to cecum=3 minutes 16 seconds.  Withdrawal time=10 minutes 55 seconds.  The scope was withdrawn and the procedure completed. COMPLICATIONS: There were no complications.  ENDOSCOPIC IMPRESSION: Normal colon No polyps or cancers  RECOMMENDATIONS: You should continue to follow colorectal  cancer screening guidelines for "routine risk" patients with a repeat colonoscopy in 10 years.    eSigned:  Rachael Fee, MD 03/28/2013 9:51 AM

## 2013-03-31 ENCOUNTER — Telehealth: Payer: Self-pay | Admitting: *Deleted

## 2013-03-31 NOTE — Telephone Encounter (Signed)
Left message that we called for f/u 

## 2013-09-20 IMAGING — CR DG LUMBAR SPINE COMPLETE 4+V
5 series · 5 of 5 positions shown · non-contrast
Comparison: None.

CLINICAL DATA: Chronic low back pain

LUMBAR SPINE - COMPLETE 4+ VIEW

[view not recorded (1 of 5)]
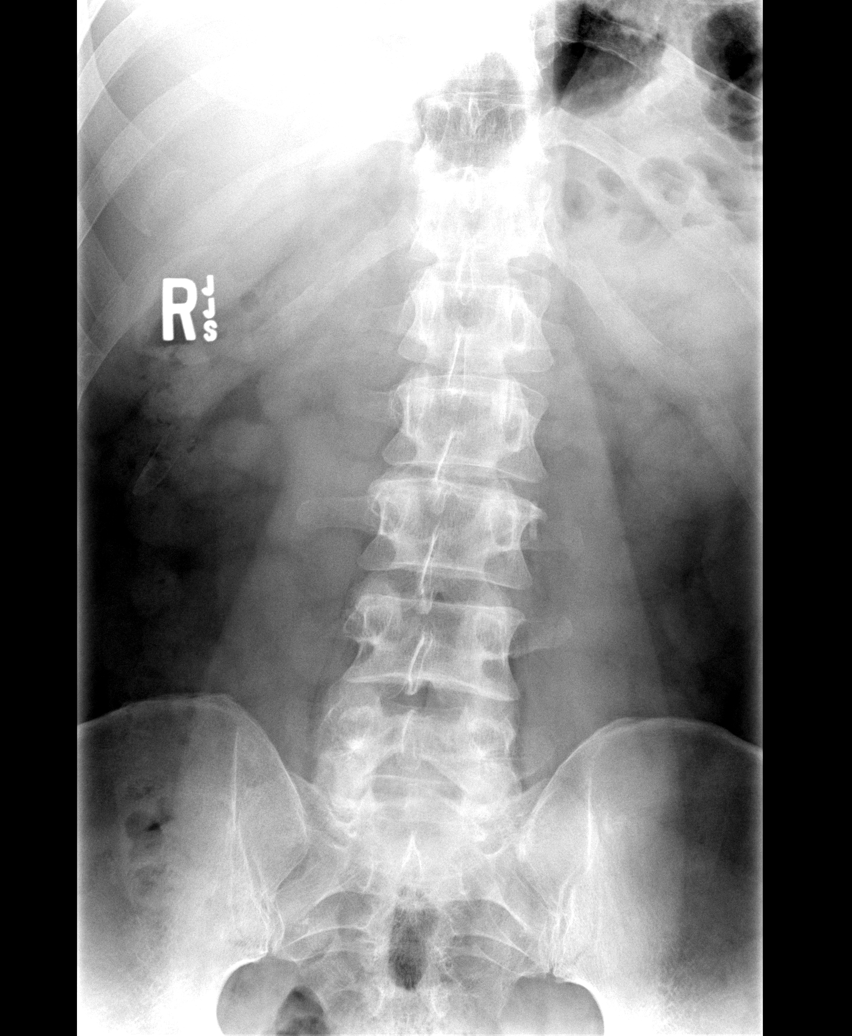

[view not recorded (2 of 5)]
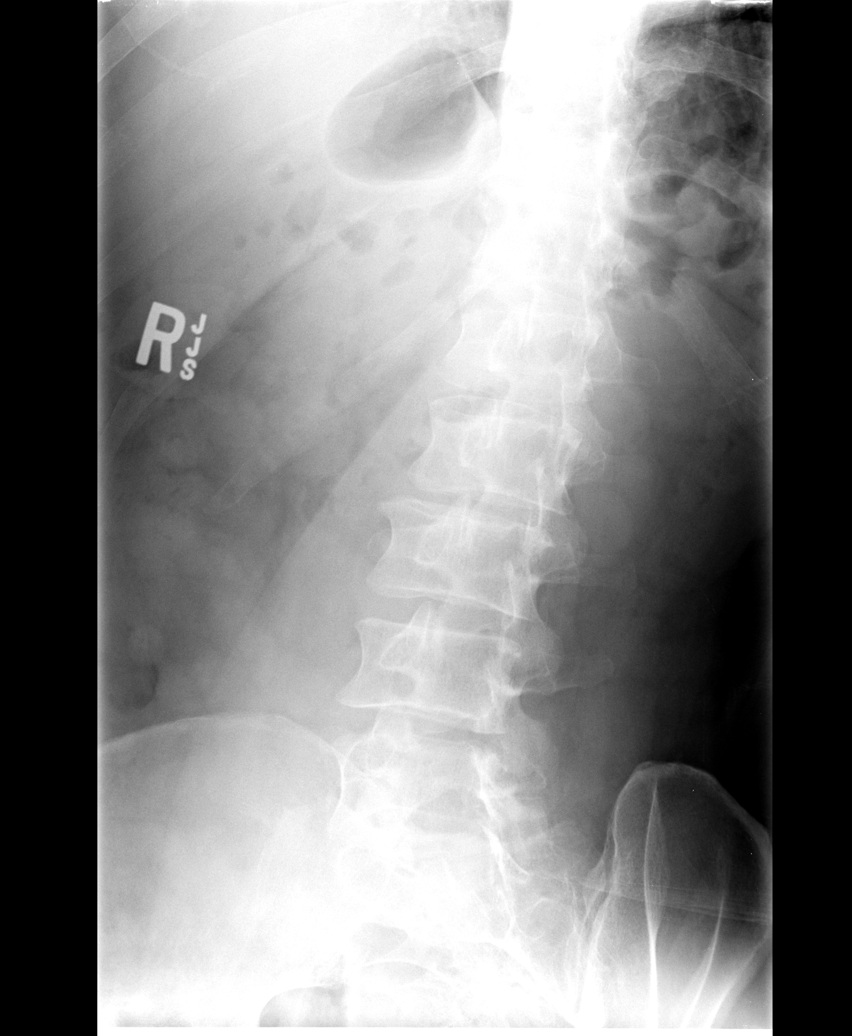

[view not recorded (3 of 5)]
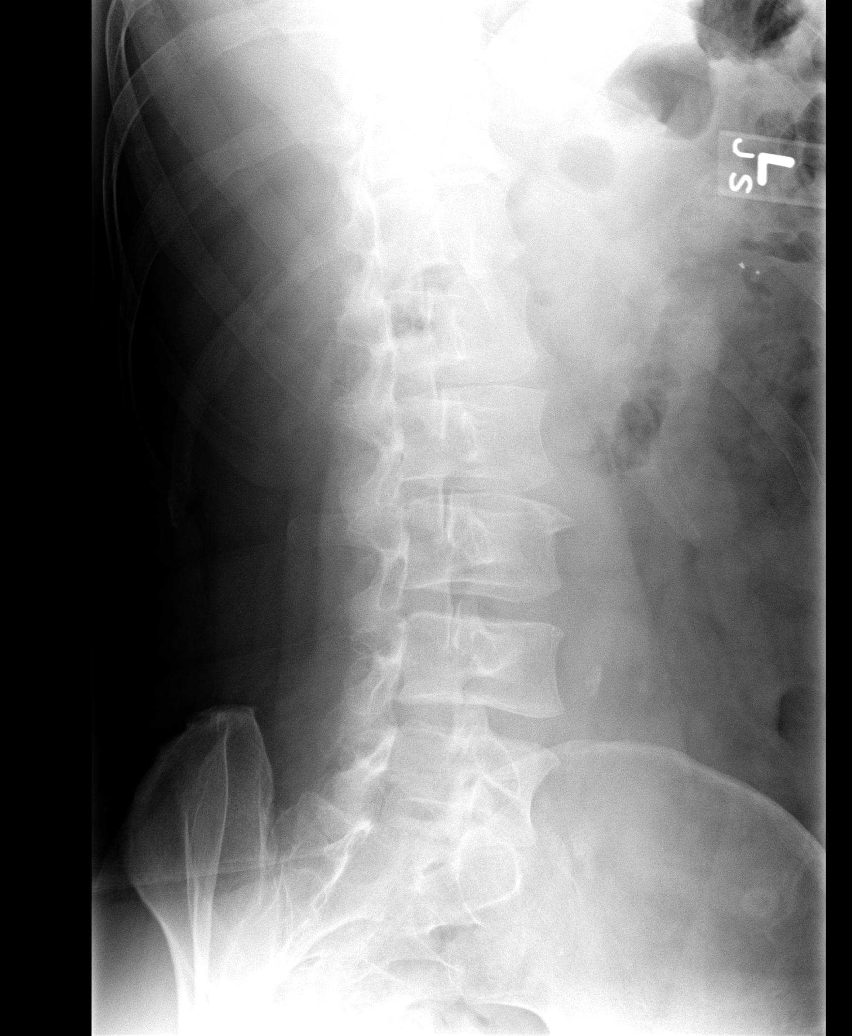

[view not recorded (4 of 5)]
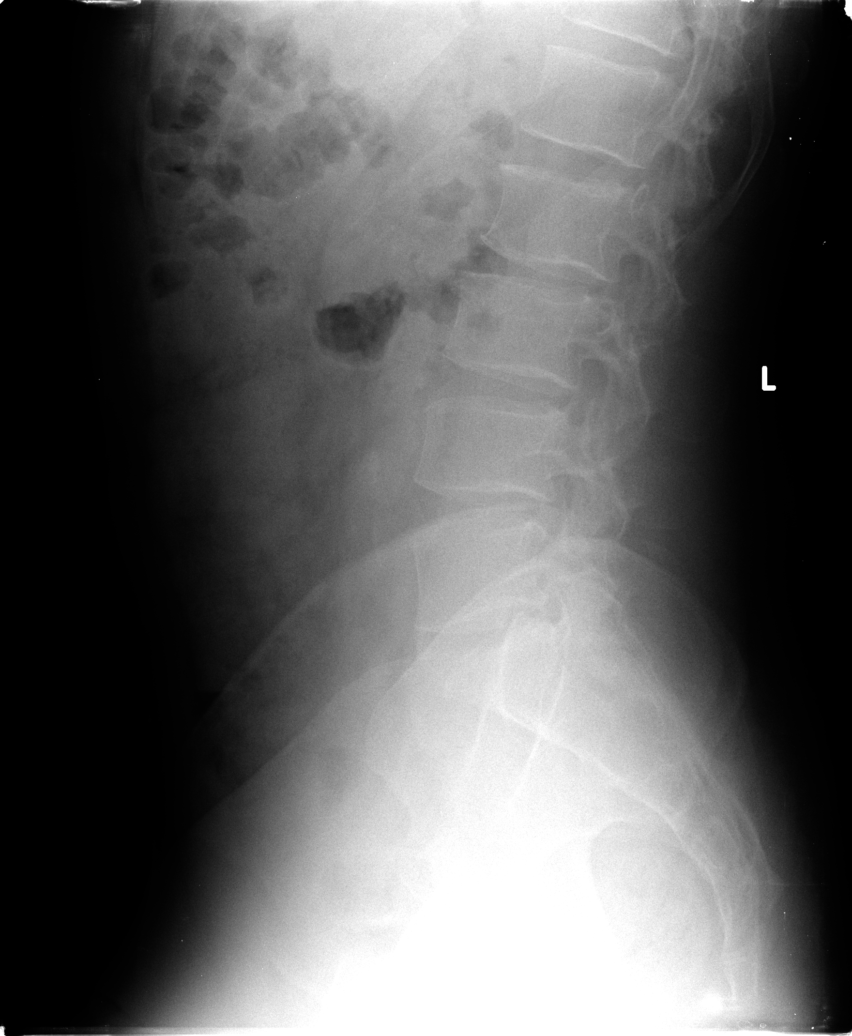

[view not recorded (5 of 5)]
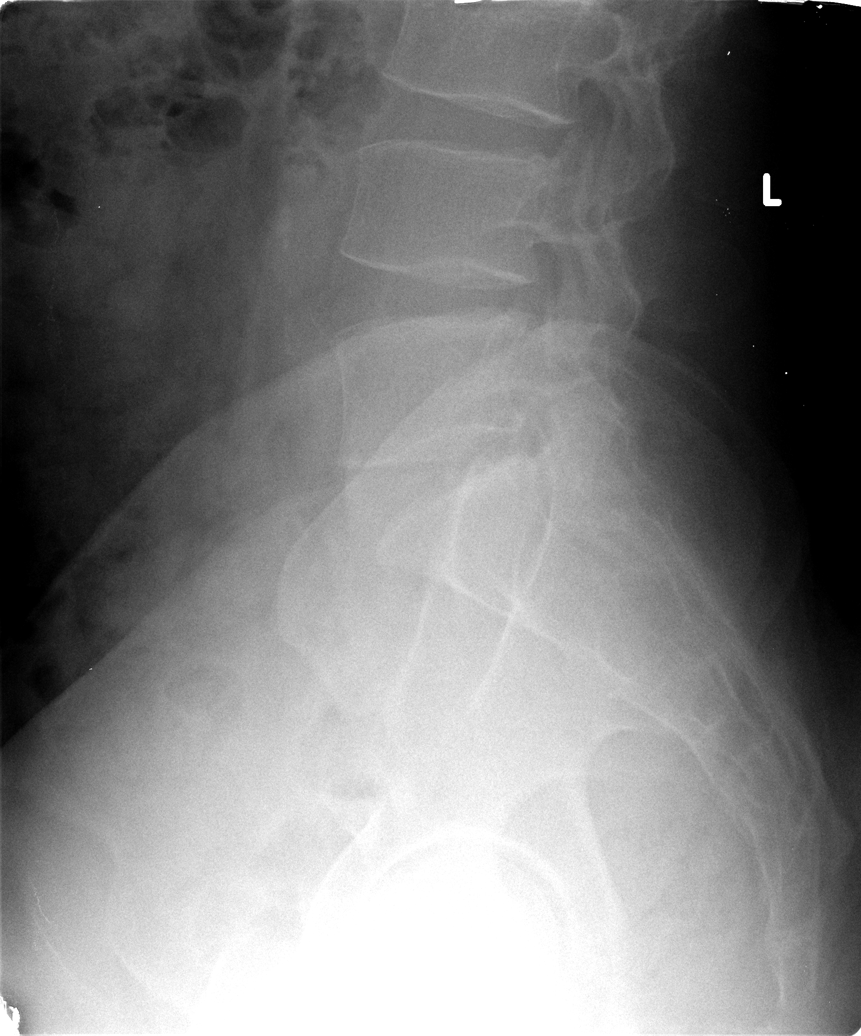

[5 of 5 positions shown; findings below may reference images not displayed]

FINDINGS: Five views of the lumbar spine submitted.  No acute
fracture or subluxation.  Minimal levoscoliosis.  Mild disc space
flattening at L2-L3 and L5 S1 level.  Alignment and vertebral
height are preserved.
IMPRESSION: Minimal levoscoliosis.  No acute fracture or subluxation.  Mild
degenerative changes.

## 2015-06-16 ENCOUNTER — Telehealth: Payer: Self-pay | Admitting: Internal Medicine

## 2015-06-16 NOTE — Telephone Encounter (Signed)
[facility] due to distance. Please advise, pt need an CPE appt

## 2015-06-16 NOTE — Telephone Encounter (Signed)
Ok with me 

## 2015-06-17 NOTE — Telephone Encounter (Signed)
Fine with me

## 2015-07-22 ENCOUNTER — Encounter: Payer: Self-pay | Admitting: Internal Medicine

## 2015-07-22 ENCOUNTER — Ambulatory Visit (INDEPENDENT_AMBULATORY_CARE_PROVIDER_SITE_OTHER): Payer: Managed Care, Other (non HMO) | Admitting: Internal Medicine

## 2015-07-22 VITALS — BP 116/78 | HR 70 | Temp 98.6°F | Ht 76.0 in | Wt 199.0 lb

## 2015-07-22 DIAGNOSIS — I519 Heart disease, unspecified: Secondary | ICD-10-CM | POA: Insufficient documentation

## 2015-07-22 DIAGNOSIS — Z0000 Encounter for general adult medical examination without abnormal findings: Secondary | ICD-10-CM | POA: Insufficient documentation

## 2015-07-22 DIAGNOSIS — K219 Gastro-esophageal reflux disease without esophagitis: Secondary | ICD-10-CM | POA: Insufficient documentation

## 2015-07-22 DIAGNOSIS — I2510 Atherosclerotic heart disease of native coronary artery without angina pectoris: Secondary | ICD-10-CM | POA: Diagnosis not present

## 2015-07-22 DIAGNOSIS — I2583 Coronary atherosclerosis due to lipid rich plaque: Secondary | ICD-10-CM

## 2015-07-22 DIAGNOSIS — M519 Unspecified thoracic, thoracolumbar and lumbosacral intervertebral disc disorder: Secondary | ICD-10-CM | POA: Insufficient documentation

## 2015-07-22 DIAGNOSIS — E785 Hyperlipidemia, unspecified: Secondary | ICD-10-CM | POA: Insufficient documentation

## 2015-07-22 NOTE — Patient Instructions (Addendum)
Your  EKG was OK today  Please continue all other medications as before, and refills have been done if requested.  Please have the pharmacy call with any other refills you may need.  Please continue your efforts at being more active, low cholesterol diet, and weight control.  You are otherwise up to date with prevention measures today.  You will be contacted regarding the referral for: cardiology (Dr Allyson SabalBerry)  Please keep your appointments with your specialists as you may have planned  Please return in 1 year for your yearly visit, or sooner if needed, with Lab testing done 3-5 days before

## 2015-07-22 NOTE — Assessment & Plan Note (Signed)
For referral card - Dr Allyson SabalBerry, to re-establish

## 2015-07-22 NOTE — Progress Notes (Signed)
Subjective:    Patient ID: Tommy Myers, male    DOB: 1960-08-25, 54 y.o.   MRN: [id number]  HPI    Here for wellness and f/u;  Overall doing ok;  Pt denies Chest pain, worsening SOB, DOE, wheezing, orthopnea, PND, worsening LE edema, palpitations, dizziness or syncope.  Pt denies neurological change such as new headache, facial or extremity weakness.  Pt denies polydipsia, polyuria, or low sugar symptoms. Pt states overall good compliance with treatment and medications, good tolerability, and has been trying to follow appropriate diet.  Pt denies worsening depressive symptoms, suicidal ideation or panic. No fever, night sweats, wt loss, loss of appetite, or other constitutional symptoms.  Pt states good ability with ADL's, has low fall risk, home safety reviewed and adequate, no other significant changes in hearing or vision, and only occasionally active with exercise.  No current complaints. Labs done at [facility] reviewed, last LDL 81 Past Medical History  Diagnosis Date  . Hyperlipidemia   . Heart disease   . Heart attack (HCC) 02/28/2007  . GERD (gastroesophageal reflux disease)     minor reflux w/ prilosec 20 yrs ago  . Lumbar disc disease    Past Surgical History  Procedure Laterality Date  . Coronary stent placement  02/28/2007    2 stents, Right coronary artery-Dr. Nanetta Batty  . Shoulder arthroscopy    . Tonsillectomy and adenoidectomy    . Wisdom tooth extraction      reports that he has never smoked. He has never used smokeless tobacco. He reports that he drinks about 0.6 oz of alcohol per week. He reports that he does not use illicit drugs. family history includes Diabetes in his father; Heart disease in his mother. There is no history of Cancer, Stroke, or Colon cancer. No Known Allergies Current Outpatient Prescriptions on File Prior to Visit  Medication Sig Dispense Refill  . aspirin 81 MG tablet Take 81 mg by mouth daily.    . flunisolide (NASALIDE) 25 MCG/ACT (0.025%)  SOLN Inhale 2 sprays into the lungs 2 (two) times daily.    . metoprolol tartrate (LOPRESSOR) 25 MG tablet Take 6.25 mg by mouth 2 (two) times daily.     . Multiple Vitamin (MULTIVITAMIN) tablet Take 1 tablet by mouth daily.    . Omega-3 Fatty Acids (FISH OIL) 1000 MG CAPS Take 1 capsule by mouth daily.    . rosuvastatin (CRESTOR) 20 MG tablet Take 5 mg by mouth daily.     Marland Kitchen acetaminophen (TYLENOL) 500 MG tablet Take 500 mg by mouth every 6 (six) hours as needed for pain.    . cyclobenzaprine (FLEXERIL) 10 MG tablet Take 10 mg by mouth as needed for muscle spasms.    Marland Kitchen HYDROcodone-acetaminophen (NORCO) 10-325 MG per tablet Take 1 tablet by mouth every 8 (eight) hours as needed for pain. (Patient not taking: Reported on 07/22/2015) 30 tablet 1  . Testosterone (ANDROGEL) 20.25 MG/1.25GM (1.62%) GEL Place onto the skin daily.     No current facility-administered medications on file prior to visit.   Review of Systems Constitutional: Negative for increased diaphoresis, other activity, appetite or siginficant weight change other than noted HENT: Negative for worsening hearing loss, ear pain, facial swelling, mouth sores and neck stiffness.   Eyes: Negative for other worsening pain, redness or visual disturbance.  Respiratory: Negative for shortness of breath and wheezing  Cardiovascular: Negative for chest pain and palpitations.  Gastrointestinal: Negative for diarrhea, blood in stool, abdominal distention or other  pain Genitourinary: Negative for hematuria, flank pain or change in urine volume.  Musculoskeletal: Negative for myalgias or other joint complaints.  Skin: Negative for color change and wound or drainage.  Neurological: Negative for syncope and numbness. other than noted Hematological: Negative for adenopathy. or other swelling Psychiatric/Behavioral: Negative for hallucinations, SI, self-injury, decreased concentration or other worsening agitation.      Objective:   Physical  Exam BP 116/78 mmHg  Pulse 70  Temp(Src) 98.6 F (37 C) (Oral)  Ht 6\' 4"  (1.93 m)  Wt 199 lb (90.266 kg)  BMI 24.23 kg/m2  SpO2 97% VS noted,  Constitutional: Pt is oriented to person, place, and time. Appears well-developed and well-nourished, in no significant distress Head: Normocephalic and atraumatic.  Right Ear: External ear normal.  Left Ear: External ear normal.  Nose: Nose normal.  Mouth/Throat: Oropharynx is clear and moist.  Eyes: Conjunctivae and EOM are normal. Pupils are equal, round, and reactive to light.  Neck: Normal range of motion. Neck supple. No JVD present. No tracheal deviation present or significant neck LA or mass Cardiovascular: Normal rate, regular rhythm, normal heart sounds and intact distal pulses.   Pulmonary/Chest: Effort normal and breath sounds without rales or wheezing  Abdominal: Soft. Bowel sounds are normal. NT. No HSM  Musculoskeletal: Normal range of motion. Exhibits no edema.  Lymphadenopathy:  Has no cervical adenopathy.  Neurological: Pt is alert and oriented to person, place, and time. Pt has normal reflexes. No cranial nerve deficit. Motor grossly intact Skin: Skin is warm and dry. No rash noted.  Psychiatric:  Has normal mood and affect. Behavior is normal.     Assessment & Plan:

## 2015-07-22 NOTE — Assessment & Plan Note (Signed)

## 2015-07-22 NOTE — Progress Notes (Signed)
Pre visit review using our clinic review tool, if applicable. No additional management support is needed unless otherwise documented below in the visit note. 

## 2015-08-09 ENCOUNTER — Ambulatory Visit: Payer: Managed Care, Other (non HMO) | Admitting: Cardiovascular Disease

## 2015-08-31 ENCOUNTER — Telehealth: Payer: Self-pay | Admitting: Cardiovascular Disease

## 2015-08-31 NOTE — Telephone Encounter (Signed)
[facility] Dr Allyson SabalBerry on 09/14/15.  Chart given to [employer] (medical records) for Dr Hazle CocaBerry's schedule on 09/14/15. lp

## 2015-09-14 ENCOUNTER — Encounter: Payer: Self-pay | Admitting: Cardiovascular Disease

## 2015-09-14 ENCOUNTER — Ambulatory Visit (INDEPENDENT_AMBULATORY_CARE_PROVIDER_SITE_OTHER): Payer: Managed Care, Other (non HMO) | Admitting: Cardiovascular Disease

## 2015-09-14 VITALS — BP 128/80 | HR 80 | Ht 76.0 in | Wt 200.0 lb

## 2015-09-14 DIAGNOSIS — E785 Hyperlipidemia, unspecified: Secondary | ICD-10-CM | POA: Diagnosis not present

## 2015-09-14 DIAGNOSIS — I519 Heart disease, unspecified: Secondary | ICD-10-CM | POA: Diagnosis not present

## 2015-09-14 NOTE — Assessment & Plan Note (Signed)
History of hyperlipidemia on Crestor with recent lipid profile performed by his PCP 04/20/15 revealing a total cholesterol 166, LDL 81 and HDL of 55.

## 2015-09-14 NOTE — Patient Instructions (Signed)

## 2015-09-14 NOTE — Progress Notes (Signed)
09/14/2015 Tommy Myers   1961-02-28  [id number]  Primary Physician Tommy Barre, MD Primary Cardiologist: Tommy Gess MD Tommy Myers   HPI:  Tommy Myers is a 55 year old fit-appearing married Caucasian male father of 3 children one of whom lives in [place] and one is graduating [employer]. He currently runs a [employer]. He is here to reestablish after last being seen in 2012. He has a history of hyperlipidemia. 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 He had what appeared to be spasm at the ostium of his left main with an EF of 45% which ultimately normalized. His last Myoview performed 08/03/11 was low risk.   Current Outpatient Prescriptions  Medication Sig Dispense Refill  . aspirin 81 MG tablet Take 81 mg by mouth daily.    . flunisolide (NASALIDE) 25 MCG/ACT (0.025%) SOLN Inhale 2 sprays into the lungs 2 (two) times daily.    . metoprolol tartrate (LOPRESSOR) 25 MG tablet Take 6.25 mg by mouth 2 (two) times daily.     . Multiple Vitamin (MULTIVITAMIN) tablet Take 1 tablet by mouth daily.    . Omega-3 Fatty Acids (FISH OIL) 1000 MG CAPS Take 1 capsule by mouth daily.    . rosuvastatin (CRESTOR) 20 MG tablet Take 5 mg by mouth daily.      No current facility-administered medications for this visit.    No Known Allergies  Social History   Social History  . Marital Status: Married    Spouse Name: N/A  . Number of Children: N/A  . Years of Education: N/A   Occupational History  . Not on file.   Social History Main Topics  . Smoking status: Never Smoker   . Smokeless tobacco: Never Used  . Alcohol Use: 0.6 oz/week    1 Glasses of wine per week  . Drug Use: No  . Sexual Activity: Yes   Other Topics Concern  . Not on file   Social History Narrative     Review of Systems: General: negative for  chills, fever, night sweats or weight changes.  Cardiovascular: negative for chest pain, dyspnea on exertion, edema, orthopnea, palpitations, paroxysmal nocturnal dyspnea or shortness of breath Dermatological: negative for rash Respiratory: negative for cough or wheezing Urologic: negative for hematuria Abdominal: negative for nausea, vomiting, diarrhea, bright red blood per rectum, melena, or hematemesis Neurologic: negative for visual changes, syncope, or dizziness All other systems reviewed and are otherwise negative except as noted above.    Blood pressure 128/80, pulse 80, height  (1.93 m), weight 200 lb (90.719 kg).  General appearance: alert and no distress Neck: no adenopathy, no carotid bruit, no JVD, supple, symmetrical, trachea midline and thyroid not enlarged, symmetric, no tenderness/mass/nodules Lungs: clear to auscultation bilaterally Heart: regular rate and rhythm, S1, S2 normal, no murmur, click, rub or gallop Extremities: extremities normal, atraumatic, no cyanosis or edema  EKG not performed today. Recent EKG performed by his PCP 07/22/15 showed a small Q-wave in lead F R-wave transition.  ASSESSMENT AND PLAN:   Hyperlipidemia History of hyperlipidemia on Crestor with recent lipid profile performed by his PCP 04/20/15 revealing a total cholesterol 166, LDL 81 and HDL of 55.  Heart disease History of CAD status post acute inferior wall myocardial infarction 02/28/07 which time cath showed a thrombotic occluded dominant RCA which I stented with 2 overlapping large (5 mm) bare-metal stents. There  was a questionable 50% ostial left main which I thought was spasm. His EF was 45% acutely with moderate inferior hypokinesia her by 2-D echo performed 04/17/07 this had completely normalized. Since that time he's been asymptomatic and is fairly active. He denies chest pain or shortness of breath.      Tommy Gess MD FACP,FACC,FAHA, Tommy Myers 09/14/2015 11:40 AM

## 2015-09-14 NOTE — Assessment & Plan Note (Signed)
History of CAD status post acute inferior wall myocardial infarction 02/28/07 which time cath showed a thrombotic occluded dominant RCA which I stented with 2 overlapping large (5 mm) bare-metal stents. There was a questionable 50% ostial left main which I thought was spasm. His EF was 45% acutely with moderate inferior hypokinesia her by 2-D echo performed 04/17/07 this had completely normalized. Since that time he's been asymptomatic and is fairly active. He denies chest pain or shortness of breath.

## 2016-12-22 ENCOUNTER — Ambulatory Visit (INDEPENDENT_AMBULATORY_CARE_PROVIDER_SITE_OTHER): Payer: Managed Care, Other (non HMO) | Admitting: Physician Assistant

## 2016-12-22 ENCOUNTER — Ambulatory Visit (INDEPENDENT_AMBULATORY_CARE_PROVIDER_SITE_OTHER): Payer: Managed Care, Other (non HMO)

## 2016-12-22 ENCOUNTER — Encounter: Payer: Self-pay | Admitting: Physician Assistant

## 2016-12-22 VITALS — BP 110/72 | HR 62 | Temp 99.1°F | Ht 76.0 in | Wt 194.0 lb

## 2016-12-22 DIAGNOSIS — R0789 Other chest pain: Secondary | ICD-10-CM

## 2016-12-22 DIAGNOSIS — J069 Acute upper respiratory infection, unspecified: Secondary | ICD-10-CM

## 2016-12-22 DIAGNOSIS — R0602 Shortness of breath: Secondary | ICD-10-CM | POA: Diagnosis not present

## 2016-12-22 MED ORDER — DOXYCYCLINE HYCLATE 100 MG PO TABS: 100.0000 mg | ORAL_TABLET | Freq: Two times a day (BID) | ORAL | 0 refills | Status: DC

## 2016-12-22 MED ORDER — HYDROCOD POLST-CPM POLST ER 10-8 MG/5ML PO SUER: 5.0000 mL | Freq: Every evening | ORAL | 0 refills | Status: DC | PRN

## 2016-12-22 NOTE — Patient Instructions (Signed)
It was great meeting you today.  Use flonase daily to help with your sinus congestion. May use plain mucinex to help with your symptoms.  We will call you with your antibiotic prescription after we receive your chest xray results.  If you develop worsening shortness of breath or fevers, please contact PCP. Also, please go to the emergency room if you develop any chest discomfort.   Upper Respiratory Infection, Adult Most upper respiratory infections (URIs) are a viral infection of the air passages leading to the lungs. A URI affects the nose, throat, and upper air passages. The most common type of URI is nasopharyngitis and is typically referred to as "the common cold." URIs run their course and usually go away on their own. Most of the time, a URI does not require medical attention, but sometimes a bacterial infection in the upper airways can follow a viral infection. This is called a secondary infection. Sinus and middle ear infections are common types of secondary upper respiratory infections. Bacterial pneumonia can also complicate a URI. A URI can worsen asthma and chronic obstructive pulmonary disease (COPD). Sometimes, these complications can require emergency medical care and may be life threatening. What are the causes? Almost all URIs are caused by viruses. A virus is a type of germ and can spread from one person to another. What increases the risk? You may be at risk for a URI if:  You smoke.  You have chronic heart or lung disease.  You have a weakened defense (immune) system.  You are very young or very old.  You have nasal allergies or asthma.  You work in crowded or poorly ventilated areas.  You work in health care facilities or schools. What are the signs or symptoms? Symptoms typically develop 2-3 days after you come in contact with a cold virus. Most viral URIs last 7-10 days. However, viral URIs from the influenza virus (flu virus) can last 14-18 days and are  typically more severe. Symptoms may include:  Runny or stuffy (congested) nose.  Sneezing.  Cough.  Sore throat.  Headache.  Fatigue.  Fever.  Loss of appetite.  Pain in your forehead, behind your eyes, and over your cheekbones (sinus pain).  Muscle aches. How is this diagnosed? Your health care provider may diagnose a URI by:  Physical exam.  Tests to check that your symptoms are not due to another condition such as:  Strep throat.  Sinusitis.  Pneumonia.  Asthma. How is this treated? A URI goes away on its own with time. It cannot be cured with medicines, but medicines may be prescribed or recommended to relieve symptoms. Medicines may help:  Reduce your fever.  Reduce your cough.  Relieve nasal congestion. Follow these instructions at home:  Take medicines only as directed by your health care provider.  Gargle warm saltwater or take cough drops to comfort your throat as directed by your health care provider.  Use a warm mist humidifier or inhale steam from a shower to increase air moisture. This may make it easier to breathe.  Drink enough fluid to keep your urine clear or pale yellow.  Eat soups and other clear broths and maintain good nutrition.  Rest as needed.  Return to work when your temperature has returned to normal or as your health care provider advises. You may need to stay home longer to avoid infecting others. You can also use a face mask and careful hand washing to prevent spread of the virus.  Increase the  usage of your inhaler if you have asthma.  Do not use any tobacco products, including cigarettes, chewing tobacco, or electronic cigarettes. If you need help quitting, ask your health care provider. How is this prevented? The best way to protect yourself from getting a cold is to practice good hygiene.  Avoid oral or hand contact with people with cold symptoms.  Wash your hands often if contact occurs. There is no clear evidence  that vitamin C, vitamin E, echinacea, or exercise reduces the chance of developing a cold. However, it is always recommended to get plenty of rest, exercise, and practice good nutrition. Contact a health care provider if:  You are getting worse rather than better.  Your symptoms are not controlled by medicine.  You have chills.  You have worsening shortness of breath.  You have brown or red mucus.  You have yellow or brown nasal discharge.  You have pain in your face, especially when you bend forward.  You have a fever.  You have swollen neck glands.  You have pain while swallowing.  You have white areas in the back of your throat. Get help right away if:  You have severe or persistent:  Headache.  Ear pain.  Sinus pain.  Chest pain.  You have chronic lung disease and any of the following:  Wheezing.  Prolonged cough.  Coughing up blood.  A change in your usual mucus.  You have a stiff neck.  You have changes in your:  Vision.  Hearing.  Thinking.  Mood. This information is not intended to replace advice given to you by your health care provider. Make sure you discuss any questions you have with your health care provider. Document Released: 01/31/2001 Document Revised: 04/09/2016 Document Reviewed: 11/12/2013 Elsevier Interactive Patient Education  2017 [employer].

## 2016-12-22 NOTE — Progress Notes (Signed)
Tommy Myers is a 56 y.o. male here for cough, chest congestion, fever, sore throat.  I acted as a Neurosurgeonscribe for Energy East CorporationSamantha Elianny Buxbaum, PA-C Corky Mullonna Orphanos, LPN  History of Present Illness:   Chief Complaint  Patient presents with  . Cough    expectorating thick yellow sputum, x 3 days  . Sore Throat  . Headache  . Fever    x 3 days, 100-101  . Fatigue  . chest congestion    HPI   Patient reports that he has had been coughing thick yellow sputum x 3 days, sore throat, HA, fever x 3 days, fatigue, chest congestion. Cut the lawn On Monday and since then has had these symptoms. He denies history of seasonal allergies. Endorses dryness in throat and fatigue. He is taking Delsym cough medicine and drinking lots of herbal tea. Appetite is fair. Feels well hydrated. Had PNA 35 years ago, treated as an outpatient. No hx asthma. Does endorse shortness of breath as well as some intermittent fluttering on his chest wall. He does have a history of a heart attack at age 56. He is [facility] well [facility]. He reports that [facility]. I do not have these records. He denies any loss of consciousness, diaphoresis, severe neck pain.  PMHx, SurgHx, SocialHx, Medications, and Allergies were reviewed in the Visit Navigator and updated as appropriate.  Current Medications:   Current Outpatient Prescriptions:  .  aspirin 81 MG tablet, Take 81 mg by mouth daily., Disp: , Rfl:  .  metoprolol tartrate (LOPRESSOR) 25 MG tablet, Take 6.25 mg by mouth daily. , Disp: , Rfl:  .  Multiple Vitamin (MULTIVITAMIN) tablet, Take 1 tablet by mouth daily., Disp: , Rfl:  .  rosuvastatin (CRESTOR) 20 MG tablet, Take 5 mg by mouth daily. , Disp: , Rfl:  .  chlorpheniramine-HYDROcodone (TUSSIONEX PENNKINETIC ER) 10-8 MG/5ML SUER, Take 5 mLs by mouth at bedtime as needed for cough., Disp: 60 mL, Rfl: 0 .  doxycycline (VIBRA-TABS) 100 MG  tablet, Take 1 tablet (100 mg total) by mouth 2 (two) times daily., Disp: 20 tablet, Rfl: 0 .  flunisolide (NASALIDE) 25 MCG/ACT (0.025%) SOLN, Inhale 2 sprays into the lungs 2 (two) times daily., Disp: , Rfl:    Review of Systems:   Review of Systems  Constitutional: Positive for chills, fever and malaise/fatigue.  HENT: Positive for congestion, ear pain and sore throat.   Eyes: Negative.   Respiratory: Positive for cough, sputum production, shortness of breath and wheezing.        Pain on inspiration with breathing, expectorating thick yellow sputum.  Gastrointestinal: Negative.   Genitourinary: Negative.   Musculoskeletal:       Body aches  Skin: Negative.   Neurological: Positive for dizziness and headaches.  Endo/Heme/Allergies: Positive for environmental allergies.  Psychiatric/Behavioral: The patient has insomnia.     Vitals:   Vitals:   12/22/16 1002  BP: 110/72  Pulse: 62  Temp: 99.1 F (37.3 C)  TempSrc: Oral  SpO2: 97%  Weight: 194 lb (88 kg)  Height: 6\' 4"  (1.93 m)     Body mass index is 23.61 kg/m.  Physical Exam:   Physical Exam  Constitutional: He appears well-developed. He is cooperative.  Non-toxic appearance. He does not have a sickly appearance. He does not appear ill. No distress.  HENT:  Head: Normocephalic and atraumatic.  Right Ear: Tympanic  membrane, external ear and ear canal normal. Tympanic membrane is not erythematous, not retracted and not bulging.  Left Ear: Tympanic membrane, external ear and ear canal normal. Tympanic membrane is not erythematous, not retracted and not bulging.  Nose: Right sinus exhibits maxillary sinus tenderness and frontal sinus tenderness. Left sinus exhibits maxillary sinus tenderness and frontal sinus tenderness.  Mouth/Throat: Uvula is midline. Posterior oropharyngeal erythema present. No posterior oropharyngeal edema. Tonsils are 0 on the right. Tonsils are 0 on the left. No tonsillar exudate.  Eyes: Conjunctivae  and lids are normal.  Neck: Trachea normal.  Cardiovascular: Normal rate, regular rhythm, S1 normal, S2 normal and normal heart sounds.   Pulmonary/Chest: Effort normal. He has decreased breath sounds in the right lower field and the left lower field. He has no wheezes. He has no rhonchi. He has no rales.  Lymphadenopathy:    He has no cervical adenopathy.  Neurological: He is alert.  Skin: Skin is warm, dry and intact.  Psychiatric: He has a normal mood and affect. His speech is normal and behavior is normal.  Nursing note and vitals reviewed.   EKG tracing is personally reviewed.  EKG notes NSR.  No acute changes.   CXR: PA and lateral IMPRESSION: No edema or consolidation.  Assessment and Plan:    [name] was seen today for cough, sore throat, headache, fever, fatigue and chest congestion.  Diagnoses and all orders for this visit:  Shortness of breath -     DG Chest 2 View; Future -     EKG 12-Lead  Chest discomfort -     EKG 12-Lead  Upper respiratory tract infection, unspecified type  Other orders -     chlorpheniramine-HYDROcodone (TUSSIONEX PENNKINETIC ER) 10-8 MG/5ML SUER; Take 5 mLs by mouth at bedtime as needed for cough. -     doxycycline (VIBRA-TABS) 100 MG tablet; Take 1 tablet (100 mg total) by mouth 2 (two) times daily.   Performed EKG given patient's symptoms and prior history of MI. EKG today is reassuring and was compared to previous EKG. Chest x-ray performed and showed no evidence of pneumonia. I recommended plain Mucinex, Flonase as well as doxycycline per orders to help with congestion and sinus pressure. Push fluids and rest. Additionally I gave him a prescription of Tussionex in order to help with symptoms and help with sleep. I advised patient if his fluttering in chest or other symptoms worsen in anyway he needs to be evaluated in the emergency department given MI history. He is agreeable to this.   . Reviewed expectations re: course of current  medical issues. . Discussed self-management of symptoms. . Outlined signs and symptoms indicating need for more acute intervention. . Patient verbalized understanding and all questions were answered. . See orders for this visit as documented in the electronic medical record. . Patient received an After-Visit Summary.  CMA or LPN served as scribe during this visit. History, Physical, and Plan performed by medical provider. Documentation and orders reviewed and attested to.  Jarold Motto, PA-C

## 2017-05-09 ENCOUNTER — Encounter: Payer: Self-pay | Admitting: Cardiovascular Disease

## 2017-05-09 ENCOUNTER — Ambulatory Visit (INDEPENDENT_AMBULATORY_CARE_PROVIDER_SITE_OTHER): Payer: Managed Care, Other (non HMO) | Admitting: Cardiovascular Disease

## 2017-05-09 VITALS — BP 114/75 | HR 73 | Ht 76.0 in | Wt 201.0 lb

## 2017-05-09 DIAGNOSIS — I2510 Atherosclerotic heart disease of native coronary artery without angina pectoris: Secondary | ICD-10-CM

## 2017-05-09 DIAGNOSIS — E7800 Pure hypercholesterolemia, unspecified: Secondary | ICD-10-CM | POA: Diagnosis not present

## 2017-05-09 DIAGNOSIS — I2583 Coronary atherosclerosis due to lipid rich plaque: Secondary | ICD-10-CM

## 2017-05-09 MED ORDER — ROSUVASTATIN CALCIUM 20 MG PO TABS: 10.0000 mg | ORAL_TABLET | Freq: Every day | ORAL | 3 refills | Status: DC

## 2017-05-09 NOTE — Progress Notes (Signed)
05/09/2017 Tommy Myers   Feb 26, 1961  [id number]  Primary Physician Tommy Levins, MD Primary Cardiologist: Runell Gess MD Nicholes Calamity, [place]  HPI:  Tommy Myers is a 56 y.o. male fit-appearing married Caucasian male father of 3 children one of whom lives in [place] and one is graduating [employer]. He currently runs a [employer]. He was last seen in the office 09/14/15. He has a history of hyperlipidemia. 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 He had what appeared to be spasm at the ostium of his left main with an EF of 45% which ultimately normalized. His last Myoview performed 08/03/11 was low risk. Since I saw him a urinary half ago he's been completely a dramatic specific denying chest pain or shortness of breath. He does exercise daily with walking or running for 30 minutes, pushups, sit ups and weight lifting.    Current Meds  Medication Sig  . aspirin 81 MG tablet Take 81 mg by mouth daily.  . flunisolide (NASALIDE) 25 MCG/ACT (0.025%) SOLN Inhale 2 sprays into the lungs 2 (two) times daily.  . metoprolol tartrate (LOPRESSOR) 25 MG tablet Take 6.25 mg by mouth daily.   . Multiple Vitamin (MULTIVITAMIN) tablet Take 1 tablet by mouth daily.  . rosuvastatin (CRESTOR) 20 MG tablet Take 0.5 tablets (10 mg total) by mouth daily.  . [DISCONTINUED] rosuvastatin (CRESTOR) 20 MG tablet Take 5 mg by mouth daily.      No Known Allergies  Social History   Social History  . Marital status: Married    Spouse name: N/A  . Number of children: N/A  . Years of education: N/A   Occupational History  . Not on file.   Social History Main Topics  . Smoking status: Never Smoker  . Smokeless tobacco: Never Used  . Alcohol use 0.6 oz/week    1 Glasses of wine per week  . Drug use: No  . Sexual activity: Yes   Other  Topics Concern  . Not on file   Social History Narrative  . No narrative on file     Review of Systems: General: negative for chills, fever, night sweats or weight changes.  Cardiovascular: negative for chest pain, dyspnea on exertion, edema, orthopnea, palpitations, paroxysmal nocturnal dyspnea or shortness of breath Dermatological: negative for rash Respiratory: negative for cough or wheezing Urologic: negative for hematuria Abdominal: negative for nausea, vomiting, diarrhea, bright red blood per rectum, melena, or hematemesis Neurologic: negative for visual changes, syncope, or dizziness All other systems reviewed and are otherwise negative except as noted above.    Blood pressure 114/75, pulse 73, height  (1.93 m), weight 201 lb (91.2 kg), SpO2 98 %.  General appearance: alert and no distress Neck: no adenopathy, no carotid bruit, no JVD, supple, symmetrical, trachea midline and thyroid not enlarged, symmetric, no tenderness/mass/nodules Lungs: clear to auscultation bilaterally Heart: regular rate and rhythm, S1, S2 normal, no murmur, click, rub or gallop Extremities: extremities normal, atraumatic, no cyanosis or edema Pulses: 2+ and symmetric Skin: Skin color, texture, turgor normal. No rashes or lesions Neurologic: Alert and oriented X 3, normal strength and tone. Normal symmetric reflexes. Normal coordination and gait  EKG not performed today  ASSESSMENT AND PLAN:   Hyperlipidemia History of hyperlipidemia on 5 mg of Crestor day with [facility] revealed total cholesterol of 210, LDL of 1:30 and HDL 48. And we'll increase his Crestor from 5-10 mg a day and we'll retest in 2 months.  Coronary artery disease due to lipid rich plaque History of CAD status post inferior wall myocardial infarction 02/28/07 with thrombotic occlusion of his dominant RCA which I stented using overlapping bare metal stents. His EF was  45% that time. His last Myoview performed 08/03/11 was nonischemic. He denies chest pain or shortness of breath.      Runell Gess MD FACP,FACC,FAHA, [facility] 05/09/2017 10:56 AM

## 2017-05-09 NOTE — Assessment & Plan Note (Signed)
History of CAD status post inferior wall myocardial infarction 02/28/07 with thrombotic occlusion of his dominant RCA which I stented using overlapping bare metal stents. His EF was 45% that time. His last Myoview performed 08/03/11 was nonischemic. He denies chest pain or shortness of breath.

## 2017-05-09 NOTE — Patient Instructions (Signed)
Medication Instructions:   INCREASE ROSUVASTATIN TO 10 MG ONCE DAILY= 1/2 OF THE 20 MG TABLET ONCE DAILY  Labwork:  Your physician recommends that you return for lab work in: 2 MONTHS = DO NOT EAT PRIOR TO LAB WORK  Follow-Up:  Your physician wants you to follow-up in: ONE YEAR WITH DR San Morelle will receive a reminder letter in the mail two months in advance. If you don't receive a letter, please call our office to schedule the follow-up appointment.   If you need a refill on your cardiac medications before your next appointment, please call your pharmacy.

## 2017-05-09 NOTE — Assessment & Plan Note (Signed)
History of hyperlipidemia on 5 mg of Crestor day with [facility]17 revealed total cholesterol of 210, LDL of 1:30 and HDL 48. And we'll increase his Crestor from 5-10 mg a day and we'll retest in 2 months.

## 2017-06-14 ENCOUNTER — Ambulatory Visit (INDEPENDENT_AMBULATORY_CARE_PROVIDER_SITE_OTHER): Payer: Managed Care, Other (non HMO) | Admitting: Physician Assistant

## 2017-06-14 ENCOUNTER — Encounter: Payer: Self-pay | Admitting: Physician Assistant

## 2017-06-14 VITALS — BP 132/70 | HR 73 | Temp 98.5°F | Wt 201.4 lb

## 2017-06-14 DIAGNOSIS — J069 Acute upper respiratory infection, unspecified: Secondary | ICD-10-CM

## 2017-06-14 NOTE — Progress Notes (Signed)
Tommy Myers is a 57 y.o. male here for a new problem.  History of Present Illness:   Chief Complaint  Patient presents with  . Sore Throat  . Ear Pain    HPI   Patient presents with sore throat and ear pain, started 4 days ago. No recent travel or sick contacts. This past weekend worked outside a bit more than usual, reports that is his only really change in routine. He is taking cough drops. Low-grade fever (sbujective) that has resolved. Appetite is good. No diarrhea. Poor sleeping. Coughing with thick mucus. No SOB, chest pain. Drinking plenty of water.   Past Medical History:  Diagnosis Date  . GERD (gastroesophageal reflux disease)    minor reflux w/ prilosec 20 yrs ago  . Heart attack (HCC) 02/28/2007  . Heart disease   . Hyperlipidemia   . Lumbar disc disease      Social History   Social History  . Marital status: Married    Spouse name: N/A  . Number of children: N/A  . Years of education: N/A   Occupational History  . Not on file.   Social History Main Topics  . Smoking status: Never Smoker  . Smokeless tobacco: Never Used  . Alcohol use 0.6 oz/week    1 Glasses of wine per week  . Drug use: No  . Sexual activity: Yes   Other Topics Concern  . Not on file   Social History Narrative  . No narrative on file    Past Surgical History:  Procedure Laterality Date  . CORONARY STENT PLACEMENT  02/28/2007   2 stents, Right coronary artery-Dr. Nanetta Batty  . SHOULDER ARTHROSCOPY    . TONSILLECTOMY AND ADENOIDECTOMY    . WISDOM TOOTH EXTRACTION      Family History  Problem Relation Age of Onset  . Cancer Neg Hx   . Stroke Neg Hx   . Colon cancer Neg Hx   . Heart disease Mother   . Diabetes Father   . Clotting disorder Maternal Uncle     No Known Allergies  Current Medications:   Current Outpatient Prescriptions:  .  aspirin 81 MG tablet, Take 81 mg by mouth daily., Disp: , Rfl:  .  flunisolide (NASALIDE) 25 MCG/ACT (0.025%) SOLN,  Inhale 2 sprays into the lungs 2 (two) times daily., Disp: , Rfl:  .  metoprolol tartrate (LOPRESSOR) 25 MG tablet, Take 6.25 mg by mouth daily. , Disp: , Rfl:  .  Multiple Vitamin (MULTIVITAMIN) tablet, Take 1 tablet by mouth daily., Disp: , Rfl:  .  rosuvastatin (CRESTOR) 20 MG tablet, Take 0.5 tablets (10 mg total) by mouth daily., Disp: 45 tablet, Rfl: 3   Review of Systems:   ROS  Negative unless otherwise specified per HPI.  Vitals:   Vitals:   06/14/17 0839  BP: 132/70  Pulse: 73  Temp: 98.5 F (36.9 C)  TempSrc: Oral  SpO2: 98%  Weight: 201 lb 6 oz (91.3 kg)     Body mass index is 24.51 kg/m.  Physical Exam:   Physical Exam  Constitutional: He appears well-developed. He is cooperative.  Non-toxic appearance. He does not have a sickly appearance. He does not appear ill. No distress.  HENT:  Head: Normocephalic and atraumatic.  Right Ear: Tympanic membrane, external ear and ear canal normal. Tympanic membrane is not erythematous, not retracted and not bulging.  Left Ear: Tympanic membrane, external ear and ear canal normal. Tympanic membrane is not erythematous, not  retracted and not bulging.  Nose: Rhinorrhea present. Right sinus exhibits no maxillary sinus tenderness and no frontal sinus tenderness. Left sinus exhibits no maxillary sinus tenderness and no frontal sinus tenderness.  Mouth/Throat: Uvula is midline and mucous membranes are normal. Posterior oropharyngeal edema present. No posterior oropharyngeal erythema. Tonsils are 1+ on the right. Tonsils are 1+ on the left. No tonsillar exudate.  Eyes: Conjunctivae and lids are normal.  Neck: Trachea normal.  Cardiovascular: Normal rate, regular rhythm, S1 normal, S2 normal and normal heart sounds.   Pulmonary/Chest: Effort normal and breath sounds normal. He has no decreased breath sounds. He has no wheezes. He has no rhonchi. He has no rales.  Lung sounds clear throughout  Lymphadenopathy:    He has no cervical  adenopathy.  Neurological: He is alert.  Skin: Skin is warm, dry and intact.  Psychiatric: He has a normal mood and affect. His speech is normal and behavior is normal.  Nursing note and vitals reviewed.    Assessment and Plan:    [name] was seen today for sore throat and ear pain.  Diagnoses and all orders for this visit:  Upper respiratory tract infection, unspecified type   Reviewed vitals and exam with patient, no red flags, no indication for antibiotics at this time. Continue supportive care -- push fluids, rest, start Mucinex and Flonase. Patient verbalized understanding. Reviewed return to clinic signs. Follow-up if any concerns.   . Reviewed expectations re: course of current medical issues. . Discussed self-management of symptoms. . Outlined signs and symptoms indicating need for more acute intervention. . Patient verbalized understanding and all questions were answered. . See orders for this visit as documented in the electronic medical record. . Patient received an After-Visit Summary.  Jarold MottoSamantha Rylei Masella, PA-C

## 2017-07-05 LAB — HEPATIC FUNCTION PANEL
ALBUMIN: 4.6 g/dL (ref 3.5–5.5)
ALT: 10 IU/L (ref 0–44)
AST: 17 IU/L (ref 0–40)
Alkaline Phosphatase: 60 IU/L (ref 39–117)
BILIRUBIN, DIRECT: 0.16 mg/dL (ref 0.00–0.40)
Bilirubin Total: 0.6 mg/dL (ref 0.0–1.2)
Total Protein: 6.8 g/dL (ref 6.0–8.5)

## 2017-07-05 LAB — LIPID PANEL
CHOLESTEROL TOTAL: 160 mg/dL (ref 100–199)
Chol/HDL Ratio: 3.3 ratio (ref 0.0–5.0)
HDL: 48 mg/dL (ref >39)
LDL CALC: 86 mg/dL (ref 0–99)
TRIGLYCERIDES: 131 mg/dL (ref 0–149)
VLDL Cholesterol Cal: 26 mg/dL (ref 5–40)

## 2017-07-09 ENCOUNTER — Other Ambulatory Visit: Payer: Self-pay | Admitting: Cardiovascular Disease

## 2017-07-09 DIAGNOSIS — E785 Hyperlipidemia, unspecified: Secondary | ICD-10-CM

## 2020-01-23 ENCOUNTER — Encounter: Payer: Self-pay | Admitting: General Practice

## 2020-06-19 ENCOUNTER — Ambulatory Visit: Payer: Managed Care, Other (non HMO) | Attending: Internal Medicine

## 2020-06-19 DIAGNOSIS — Z23 Encounter for immunization: Secondary | ICD-10-CM

## 2020-06-19 NOTE — Progress Notes (Signed)
   Covid-19 Vaccination Clinic  Name:  Tommy Myers    MRN: [id number] DOB: 05/04/1961  06/19/2020  [name] was observed post Covid-19 immunization for 15 minutes without incident. He was provided with Vaccine Information Sheet and instruction to access the V-Safe system.   [name] was instructed to call 911 with any severe reactions post vaccine: Marland Kitchen Difficulty breathing  . Swelling of face and throat  . A fast heartbeat  . A bad rash all over body  . Dizziness and weakness

## 2022-03-09 ENCOUNTER — Ambulatory Visit (INDEPENDENT_AMBULATORY_CARE_PROVIDER_SITE_OTHER): Payer: No Typology Code available for payment source | Admitting: Neurology

## 2022-03-09 ENCOUNTER — Encounter: Payer: Self-pay | Admitting: Neurology

## 2022-03-09 VITALS — BP 115/70 | HR 64 | Ht 76.0 in | Wt 192.0 lb

## 2022-03-09 DIAGNOSIS — M5441 Lumbago with sciatica, right side: Secondary | ICD-10-CM

## 2022-03-09 DIAGNOSIS — M5442 Lumbago with sciatica, left side: Secondary | ICD-10-CM | POA: Diagnosis not present

## 2022-03-09 DIAGNOSIS — G8929 Other chronic pain: Secondary | ICD-10-CM | POA: Diagnosis not present

## 2022-03-09 NOTE — Patient Instructions (Signed)
Continue with physical therapy Continue with yoga Continue to follow with primary care doctor and return as needed

## 2022-03-09 NOTE — Progress Notes (Signed)
[facility]  PATIENT: Tommy Myers DOB: October 04, 1960  REQUESTING CLINICIAN: Windy Fast, MD HISTORY FROM: Patient  REASON FOR VISIT: Back pain    HISTORICAL  CHIEF COMPLAINT:  Chief Complaint  Patient presents with   New Patient (Initial Visit)    Room 15, alone  NP/Paper/[facility] Otila Kluver [phone])/Balance Problems Numbness in both feet worse in left foot, c/o some pain and balance issues     HISTORY OF PRESENT ILLNESS:  This is a 61 year old gentleman past medical history of chronic back pain, had an accident while in the [place] 20 years ago, PTSD who is presenting with further evaluation.  Patient's reports 20 years ago, a large container around 700 pounds fell on top of him causing him to hurt his back.  At that time he did have herniated disc, and since then has been dealing with chronic back pain and sciatica worse on the left.  He said currently he does not have any pain but he does have tingling sensation left worse than right.  He is doing daily yoga and physical therapy.  He did follow-up with a surgeon who discussed with patient possibility of having surgical intervention but patient would like to postpone it.  He denies any dizziness, denies any trouble with ambulation.  He denies any weakness of the lower extremity   OTHER MEDICAL CONDITIONS: Chronic back pain, PTSD    REVIEW OF SYSTEMS: Full 14 system review of systems performed and negative with exception of: as noted in the HPI   ALLERGIES: No Known Allergies  HOME MEDICATIONS: Outpatient Medications Prior to Visit  Medication Sig Dispense Refill   acetaminophen (TYLENOL) 500 MG tablet Take 500 mg by mouth every 6 (six) hours as needed (prn).     Alirocumab (PRALUENT [place]) Inject into the skin.     aspirin 81 MG tablet Take 81 mg by mouth daily.     flunisolide (NASALIDE) 25 MCG/ACT (0.025%) SOLN Inhale 2 sprays into the lungs 2 (two) times daily.     icosapent Ethyl  (VASCEPA) 1 g capsule Take 2 g by mouth 2 (two) times daily.     meloxicam (MOBIC) 7.5 MG tablet Take 1 tablet by mouth 2 (two) times daily as needed.     mirtazapine (REMERON) 15 MG tablet TAKE ONE TABLET BY MOUTH AT BEDTIME FOR SLEEP AND ANXIETY     Multiple Vitamin (MULTIVITAMIN) tablet Take 1 tablet by mouth daily.     zolpidem (AMBIEN) 10 MG tablet Take by mouth.     metoprolol tartrate (LOPRESSOR) 25 MG tablet Take 6.25 mg by mouth daily.      rosuvastatin (CRESTOR) 20 MG tablet Take 0.5 tablets (10 mg total) by mouth daily. 45 tablet 3   No facility-administered medications prior to visit.    PAST MEDICAL HISTORY: Past Medical History:  Diagnosis Date   GERD (gastroesophageal reflux disease)    minor reflux w/ prilosec 20 yrs ago   Heart attack ([place]) 02/28/2007   Heart disease    Hyperlipidemia    Lumbar disc disease     PAST SURGICAL HISTORY: Past Surgical History:  Procedure Laterality Date   CORONARY STENT PLACEMENT  02/28/2007   2 stents, Right coronary artery-Dr. Quay Burow   SHOULDER ARTHROSCOPY     TONSILLECTOMY AND ADENOIDECTOMY     WISDOM TOOTH EXTRACTION      FAMILY HISTORY: Family History  Problem Relation Age of Onset   Cancer Neg Hx    Stroke Neg Hx  Colon cancer Neg Hx    Heart disease Mother    Diabetes Father    Clotting disorder Maternal Uncle     SOCIAL HISTORY: Social History   Socioeconomic History   Marital status: Married    Spouse name: Not on file   Number of children: Not on file   Years of education: Not on file   Highest education level: Not on file  Occupational History   Not on file  Tobacco Use   Smoking status: Never   Smokeless tobacco: Never  Substance and Sexual Activity   Alcohol use: Yes    Alcohol/week: 1.0 standard drink of alcohol    Types: 1 Glasses of wine per week   Drug use: No   Sexual activity: Yes  Other Topics Concern   Not on file  Social History Narrative   Not on file   Social Determinants  of Health   Financial Resource Strain: Not on file  Food Insecurity: Not on file  Transportation Needs: Not on file  Physical Activity: Not on file  Stress: Not on file  Social Connections: Not on file  Intimate Partner Violence: Not on file    PHYSICAL EXAM  GENERAL EXAM/CONSTITUTIONAL: Vitals:  Vitals:   03/09/22 0816  BP: 115/70  Pulse: 64  Weight: 192 lb (87.1 kg)  Height: $Remove'6\' 4"'ILoSBdb$  (1.93 m)   Body mass index is 23.37 kg/m. Wt Readings from Last 3 Encounters:  03/09/22 192 lb (87.1 kg)  06/14/17 201 lb 6 oz (91.3 kg)  05/09/17 201 lb (91.2 kg)   Patient is in no distress; well developed, nourished and groomed; neck is supple  EYES: Pupils round and reactive to light, Visual fields full to confrontation, Extraocular movements intacts,   MUSCULOSKELETAL: Gait, strength, tone, movements noted in Neurologic exam below  NEUROLOGIC: MENTAL STATUS:      No data to display         awake, alert, oriented to person, place and time recent and remote memory intact normal attention and concentration language fluent, comprehension intact, naming intact fund of knowledge appropriate  CRANIAL NERVE:  2nd, 3rd, 4th, 6th - pupils equal and reactive to light, visual fields full to confrontation, extraocular muscles intact, no nystagmus 5th - facial sensation symmetric 7th - facial strength symmetric 8th - hearing intact 9th - palate elevates symmetrically, uvula midline 11th - shoulder shrug symmetric 12th - tongue protrusion midline  MOTOR:  normal bulk and tone, full strength in the BUE, BLE.  Negative straight leg raise.  Patient has normal hip internal and external rotation without pain can raise his leg up to 100 degrees at the hip without any pain.  He can heel walk and toe walk without pain.  SENSORY:  normal and symmetric to light touch, pinprick, temperature, vibration  COORDINATION:  finger-nose-finger, fine finger movements normal  REFLEXES:  deep tendon  reflexes present and symmetric  GAIT/STATION:  Normal, able to tandem   DIAGNOSTIC DATA (LABS, IMAGING, TESTING) - I reviewed patient records, labs, notes, testing and imaging myself where available.  Lab Results  Component Value Date   WBC 8.5 03/01/2007   HGB 13.3 03/01/2007   HCT 38.6 (L) 03/01/2007   MCV 90.7 03/01/2007   PLT 242 03/01/2007      Component Value Date/Time   NA 141 03/04/2007 0422   K 3.8 03/04/2007 0422   CL 103 03/04/2007 0422   CO2 28 03/04/2007 0422   GLUCOSE 90 03/04/2007 0422   BUN 15 03/04/2007  0422   CREATININE 1.16 03/04/2007 0422   CALCIUM 9.2 03/04/2007 0422   PROT 6.8 07/05/2017 0814   ALBUMIN 4.6 07/05/2017 0814   AST 17 07/05/2017 0814   ALT 10 07/05/2017 0814   ALKPHOS 60 07/05/2017 0814   BILITOT 0.6 07/05/2017 0814   GFRNONAA >60 03/04/2007 0422   GFRAA  03/04/2007 0422    >60        The eGFR has been calculated using the MDRD equation. This calculation has not been validated in all clinical   Lab Results  Component Value Date   CHOL 160 07/05/2017   HDL 48 07/05/2017   [place] 86 07/05/2017   TRIG 131 07/05/2017   CHOLHDL 3.3 07/05/2017   No results found for: "HGBA1C" No results found for: "VITAMINB12" No results found for: "TSH"  Review of medical record show imaging study demonstrating degenerative scoliosis with degenerative disc disease.     ASSESSMENT AND PLAN  61 y.o. year old male with history of PTSD and chronic back pain who is presenting for evaluation.  Patient reports currently he is asymptomatic.  But with the back pain he has intermittent low back pain, radiating to both legs, with tingling left worse than right.  He does not have any issue with balance or ambulation.  He can toe walk, heel walk and tandem walk. On exam, his straight leg list was negative.  At this time, I have recommended patient to continue with yoga, continue with physical therapy, continue to follow with PCP and return as  needed.   1. Chronic midline low back pain with bilateral sciatica      Patient Instructions  Continue with physical therapy Continue with yoga Continue to follow with primary care doctor and return as needed    No orders of the defined types were placed in this encounter.   No orders of the defined types were placed in this encounter.   Return if symptoms worsen or fail to improve.  I have spent a total of 45 minutes dedicated to this patient today, preparing to see patient, performing a medically appropriate examination and evaluation, ordering tests and/or medications and procedures, and counseling and educating the patient/family/caregiver; independently interpreting result and communicating results to the family/patient/caregiver; and documenting clinical information in the electronic medical record.   Alric Ran, MD 03/09/2022, 2:06 PM  [facility] [address] [place], [place] [address] [phone]
